# Patient Record
Sex: Female | Born: 2000 | Race: White | Hispanic: No | Marital: Single | Smoking: Never smoker
Health system: Southern US, Community
[De-identification: ages and names within clinical notes are randomized; demographics above are authoritative.]

## PROBLEM LIST (undated history)

## (undated) DIAGNOSIS — N926 Irregular menstruation, unspecified: Secondary | ICD-10-CM

## (undated) HISTORY — PX: NO PAST SURGERIES: SHX2092

## (undated) HISTORY — DX: Irregular menstruation, unspecified: N92.6

---
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
## 2020-02-19 ENCOUNTER — Ambulatory Visit (INDEPENDENT_AMBULATORY_CARE_PROVIDER_SITE_OTHER): Payer: PRIVATE HEALTH INSURANCE | Admitting: Certified Nurse Midwife

## 2020-02-19 ENCOUNTER — Other Ambulatory Visit: Payer: Self-pay

## 2020-02-19 VITALS — BP 132/77 | HR 112 | Ht 61.0 in | Wt 111.7 lb

## 2020-02-19 DIAGNOSIS — Z3042 Encounter for surveillance of injectable contraceptive: Secondary | ICD-10-CM

## 2020-02-19 MED ORDER — MEDROXYPROGESTERONE ACETATE 150 MG/ML IM SUSP
150.0000 mg | Freq: Once | INTRAMUSCULAR | Status: AC
  Administered 2020-02-19: 14:00:00 150 mg via INTRAMUSCULAR

## 2020-02-19 NOTE — Progress Notes (Signed)
Date last pap:NA Last Depo-Provera: 11/27/2019 Side Effects if any: NA Serum HCG indicated? NA Depo-Provera 150 mg IM given by: J. [facility] CMA Next appointment due 05/06/20-05/20/20  Today's Vitals   02/19/20 1400  BP: 132/77  Pulse: (!) 112  Weight: 111 lb 11.2 oz (50.7 kg)  Height: 5\' 1"  (1.549 m)   Body mass index is 21.11 kg/m.

## 2020-05-07 ENCOUNTER — Ambulatory Visit: Payer: PRIVATE HEALTH INSURANCE

## 2020-05-14 ENCOUNTER — Other Ambulatory Visit: Payer: Self-pay | Admitting: Certified Nurse Midwife

## 2020-05-14 ENCOUNTER — Ambulatory Visit: Payer: PRIVATE HEALTH INSURANCE

## 2020-05-16 ENCOUNTER — Ambulatory Visit (INDEPENDENT_AMBULATORY_CARE_PROVIDER_SITE_OTHER): Payer: PRIVATE HEALTH INSURANCE | Admitting: Surgical

## 2020-05-16 ENCOUNTER — Other Ambulatory Visit: Payer: Self-pay

## 2020-05-16 DIAGNOSIS — Z3042 Encounter for surveillance of injectable contraceptive: Secondary | ICD-10-CM

## 2020-05-16 MED ORDER — MEDROXYPROGESTERONE ACETATE 150 MG/ML IM SUSP
150.0000 mg | Freq: Once | INTRAMUSCULAR | Status: AC
  Administered 2020-05-16: 150 mg via INTRAMUSCULAR

## 2020-05-16 NOTE — Progress Notes (Signed)
Date last pap:NA Last Depo-Provera: 02/19/2020 Side Effects if any:NA Serum HCG indicated? NA Depo-Provera 150 mg IM given by: J. [facility] CMA Next appointment due 08/01/2020-08/15/2020

## 2020-06-16 ENCOUNTER — Emergency Department: Payer: BC Managed Care – PPO

## 2020-06-16 ENCOUNTER — Emergency Department
Admission: EM | Admit: 2020-06-16 | Discharge: 2020-06-16 | Disposition: A | Discharge status: Home / Self Care | Payer: BC Managed Care – PPO | Attending: Emergency Medicine | Admitting: Emergency Medicine

## 2020-06-16 ENCOUNTER — Other Ambulatory Visit: Payer: Self-pay

## 2020-06-16 ENCOUNTER — Encounter: Payer: Self-pay | Admitting: Emergency Medicine

## 2020-06-16 DIAGNOSIS — N10 Acute tubulo-interstitial nephritis: Secondary | ICD-10-CM | POA: Insufficient documentation

## 2020-06-16 DIAGNOSIS — R300 Dysuria: Secondary | ICD-10-CM | POA: Diagnosis present

## 2020-06-16 LAB — URINALYSIS, ROUTINE W REFLEX MICROSCOPIC
Bacteria, UA: NONE SEEN
Bilirubin Urine: NEGATIVE
Glucose, UA: NEGATIVE mg/dL
Ketones, ur: NEGATIVE mg/dL
Nitrite: NEGATIVE
Protein, ur: 30 mg/dL — AB
Specific Gravity, Urine: 1.016 (ref 1.005–1.030)
WBC, UA: >50 WBC/hpf — ABNORMAL HIGH (ref 0–5)
pH: 5 (ref 5.0–8.0)

## 2020-06-16 LAB — POCT PREGNANCY, URINE: Preg Test, Ur: NEGATIVE

## 2020-06-16 MED ORDER — ONDANSETRON 4 MG PO TBDP
4.0000 mg | ORAL_TABLET | Freq: Once | ORAL | Status: AC
  Administered 2020-06-16: 4 mg via ORAL
  Filled 2020-06-16: qty 1

## 2020-06-16 MED ORDER — SULFAMETHOXAZOLE-TRIMETHOPRIM 800-160 MG PO TABS
1.0000 | ORAL_TABLET | Freq: Once | ORAL | Status: AC
  Administered 2020-06-16: 1 via ORAL
  Filled 2020-06-16: qty 1

## 2020-06-16 MED ORDER — CEFTRIAXONE SODIUM 1 G IJ SOLR
1.0000 g | Freq: Once | INTRAMUSCULAR | Status: AC
Start: 2020-06-16 — End: 2020-06-16
  Administered 2020-06-16: 1 g via INTRAMUSCULAR
  Filled 2020-06-16: qty 10

## 2020-06-16 MED ORDER — PHENAZOPYRIDINE HCL 100 MG PO TABS
95.0000 mg | ORAL_TABLET | Freq: Once | ORAL | Status: AC
  Administered 2020-06-16: 100 mg via ORAL
  Filled 2020-06-16: qty 1

## 2020-06-16 MED ORDER — SULFAMETHOXAZOLE-TRIMETHOPRIM 800-160 MG PO TABS: 1.0000 | ORAL_TABLET | Freq: Two times a day (BID) | ORAL | 0 refills | Status: DC

## 2020-06-16 NOTE — ED Provider Notes (Signed)
[facility] Emergency Department Provider Note  ____________________________________________  Time seen: Approximately 10:36 AM  I have reviewed the triage vital signs and the nursing notes.   HISTORY  Chief Complaint Dysuria    HPI Cleatus Goodin is a 19 y.o. female that presents to the emergency department for evaluation of urinary urgency, frequency, right flank pain for 2 days.  Patient states that when she urinates, she urinates very little at a time and then has to go very shortly after.  Patient states that she has a history of urinary tract infections and this feels the same.  She is concerned for a kidney infection.  She is usually able to take cranberry juice, which resolves the symptoms.  She has felt nauseous.  She does not have a menstrual cycle and denies any vaginal bleeding.  No fevers, vomiting, abdominal pain.   Past Medical History:  Diagnosis Date  . Irregular periods/menstrual cycles     Patient Active Problem List   Diagnosis Date Noted  . Breakthrough bleeding on Depo-Provera 06/22/2019    Past Surgical History:  Procedure Laterality Date  . NO PAST SURGERIES      Prior to Admission medications   Medication Sig Start Date End Date Taking? Authorizing Provider  medroxyPROGESTERone (DEPO-PROVERA) 150 MG/ML injection ADMINISTER 1 ML(150 MG) IN THE MUSCLE EVERY 3 MONTHS 05/14/20   Lawhorn, Vanessa Maryhill, CNM  sulfamethoxazole-trimethoprim (BACTRIM DS) 800-160 MG tablet Take 1 tablet by mouth 2 (two) times daily. 06/16/20   Enid Derry, PA-C    Allergies Patient has no known allergies.  Family History  Problem Relation Age of Onset  . Cancer Father        skin   . Breast cancer Neg Hx   . Ovarian cancer Neg Hx   . Colon cancer Neg Hx   . Diabetes Neg Hx     Social History Social History   Tobacco Use  . Smoking status: Never Smoker  . Smokeless tobacco: Never Used  Vaping Use  . Vaping Use: Some days  Substance  Use Topics  . Alcohol use: No  . Drug use: No     Review of Systems  Constitutional: No fever/chills Cardiovascular: No chest pain. Respiratory: No cough. No SOB. Gastrointestinal: Positive for flank pain..  No nausea, no vomiting.  Genitourinary: Negative for dysuria.  Positive for urgency and frequency. Musculoskeletal: Negative for musculoskeletal pain. Skin: Negative for rash, abrasions, lacerations, ecchymosis. Neurological: Negative for headaches   ____________________________________________   PHYSICAL EXAM:  VITAL SIGNS: ED Triage Vitals  Enc Vitals Group     BP 06/16/20 1000 112/75     Pulse Rate 06/16/20 1000 88     Resp 06/16/20 1000 18     Temp 06/16/20 1000 98.2 F (36.8 C)     Temp Source 06/16/20 1000 Oral     SpO2 06/16/20 1000 99 %     Weight 06/16/20 0959 109 lb (49.4 kg)     Height 06/16/20 0959 5\' 1"  (1.549 m)     Head Circumference --      Peak Flow --      Pain Score 06/16/20 1004 4     Pain Loc --      Pain Edu? --      Excl. in GC? --      Constitutional: Alert and oriented. Well appearing and in no acute distress. Eyes: Conjunctivae are normal. PERRL. EOMI. Head: Atraumatic. ENT:      Ears:  Nose: No congestion/rhinnorhea.      Mouth/Throat: Mucous membranes are moist.  Neck: No stridor.  Cardiovascular: Normal rate, regular rhythm.  Good peripheral circulation. Respiratory: Normal respiratory effort without tachypnea or retractions. Lungs CTAB. Good air entry to the bases with no decreased or absent breath sounds. Gastrointestinal: Bowel sounds 4 quadrants. Soft and nontender to palpation. No guarding or rigidity. No palpable masses. No distention.  Right CVA tenderness. Musculoskeletal: Full range of motion to all extremities. No gross deformities appreciated. Neurologic:  Normal speech and language. No gross focal neurologic deficits are appreciated.  Skin:  Skin is warm, dry and intact. No rash noted. Psychiatric: Mood and  affect are normal. Speech and behavior are normal. Patient exhibits appropriate insight and judgement.   ____________________________________________   LABS (all labs ordered are listed, but only abnormal results are displayed)  Labs Reviewed  URINALYSIS, ROUTINE W REFLEX MICROSCOPIC - Abnormal; Notable for the following components:      Result Value   Color, Urine YELLOW (*)    APPearance CLOUDY (*)    Hgb urine dipstick SMALL (*)    Protein, ur 30 (*)    Leukocytes,Ua LARGE (*)    WBC, UA >50 (*)    All other components within normal limits  URINE CULTURE  POC URINE PREG, ED  POCT PREGNANCY, URINE   ____________________________________________  EKG   ____________________________________________  RADIOLOGY  US Renal  Result Date: 06/16/2020 CLINICAL DATA:  UTI, flank pain EXAM: RENAL / URINARY TRACT ULTRASOUND COMPLETE COMPARISON:  None. FINDINGS: Right Kidney: Renal measurements: 10.6 x 4.4 x 5.2 cm = volume: 126 mL . Echogenicity within normal limits. No mass or hydronephrosis visualized. Left Kidney: Renal measurements: 10.6 x 5.7 x 4.3 cm = volume: 137 mL. Echogenicity within normal limits. No mass or hydronephrosis visualized. Bladder: Appears normal for degree of bladder distention. Other: None. IMPRESSION: Normal ultrasound examination of the bilateral kidneys and urinary bladder. Electronically Signed   By: Lauralyn Primes M.D.   On: 06/16/2020 11:38    ____________________________________________    PROCEDURES  Procedure(s) performed:    Procedures    Medications  cefTRIAXone (ROCEPHIN) injection 1 g (1 g Intramuscular Given 06/16/20 1114)  ondansetron (ZOFRAN-ODT) disintegrating tablet 4 mg (4 mg Oral Given 06/16/20 1114)  phenazopyridine (PYRIDIUM) tablet 100 mg (100 mg Oral Given 06/16/20 1121)  sulfamethoxazole-trimethoprim (BACTRIM DS) 800-160 MG per tablet 1 tablet (1 tablet Oral Given 06/16/20 1302)      ____________________________________________   INITIAL IMPRESSION / ASSESSMENT AND PLAN / ED COURSE  Pertinent labs & imaging results that were available during my care of the patient were reviewed by me and considered in my medical decision making (see chart for details).  Review of the [place] CSRS was performed in accordance of the [facility] prior to dispensing any controlled drugs.  Differential diagnosis includes, but is not limited to, biliary disease (biliary colic, acute cholecystitis, cholangitis, choledocholithiasis, etc), renal stone, pyelonephritis, intrathoracic causes for epigastric abdominal pain including ACS, gastritis, pancreatitis, small bowel or large bowel obstruction, abdominal aortic aneurysm, hernia, and ulcer(s).   Patient's diagnosis is consistent with urinary tract infection.  Vital signs and exam are reassuring.  Symptoms are consistent with a urinary tract infection.  Patient is nontoxic.  Renal ultrasound negative for acute abnormalities.  Urinalysis shows hemoglobin, red blood cells, white blood cells, leukocytes.  Patient does not have a menstrual cycle and denies any vaginal bleeding.  Urine will be sent for culture.  Patient was given IM ceftriaxone  for infection.  Patient will be discharged home with prescriptions for Bactrim. Patient is to follow up with PCP as directed. Patient is given ED precautions to return to the ED for any worsening or new symptoms.  Apollonia Amini was evaluated in Emergency Department on 06/16/2020 for the symptoms described in the history of present illness. She was evaluated in the context of the global COVID-19 pandemic, which necessitated consideration that the patient might be at risk for infection with the SARS-CoV-2 virus that causes COVID-19. Institutional protocols and algorithms that pertain to the evaluation of patients at risk for COVID-19 are in a state of rapid change based on information released by regulatory bodies including the  CDC and federal and state organizations. These policies and algorithms were followed during the patient's care in the ED.   ____________________________________________  FINAL CLINICAL IMPRESSION(S) / ED DIAGNOSES  Final diagnoses:  Acute pyelonephritis      NEW MEDICATIONS STARTED DURING THIS VISIT:  ED Discharge Orders         Ordered    sulfamethoxazole-trimethoprim (BACTRIM DS) 800-160 MG tablet  2 times daily     Discontinue  Reprint     06/16/20 1238              This chart was dictated using voice recognition software/Dragon. Despite best efforts to proofread, errors can occur which can change the meaning. Any change was purely unintentional.    Enid Derry, PA-C 06/16/20 1509    Sharyn Creamer, MD 06/16/20 [phone]

## 2020-06-16 NOTE — ED Triage Notes (Signed)
First RN Note: pt presents to ED via POV, ambulatory to the desk without any difficulty at this time. Pt states "I think I have a kidney infection".

## 2020-06-16 NOTE — ED Triage Notes (Signed)
Pt to ER reports frequent urination, left flank pain and nausea.  Pt reports symptoms for last 4 days.  PT states hx of kidney infections.

## 2020-06-19 LAB — URINE CULTURE: Culture: >=100000 — AB

## 2020-06-24 ENCOUNTER — Other Ambulatory Visit: Payer: Self-pay

## 2020-06-24 ENCOUNTER — Ambulatory Visit (INDEPENDENT_AMBULATORY_CARE_PROVIDER_SITE_OTHER): Payer: PRIVATE HEALTH INSURANCE | Admitting: Certified Nurse Midwife

## 2020-06-24 ENCOUNTER — Encounter: Payer: Self-pay | Admitting: Certified Nurse Midwife

## 2020-06-24 VITALS — BP 116/75 | HR 102 | Ht 61.0 in | Wt 106.7 lb

## 2020-06-24 DIAGNOSIS — R110 Nausea: Secondary | ICD-10-CM | POA: Diagnosis not present

## 2020-06-24 DIAGNOSIS — Z3042 Encounter for surveillance of injectable contraceptive: Secondary | ICD-10-CM | POA: Diagnosis not present

## 2020-06-24 DIAGNOSIS — Z01419 Encounter for gynecological examination (general) (routine) without abnormal findings: Secondary | ICD-10-CM | POA: Diagnosis not present

## 2020-06-24 DIAGNOSIS — Z87440 Personal history of urinary (tract) infections: Secondary | ICD-10-CM | POA: Diagnosis not present

## 2020-06-24 DIAGNOSIS — N6315 Unspecified lump in the right breast, overlapping quadrants: Secondary | ICD-10-CM | POA: Insufficient documentation

## 2020-06-24 MED ORDER — MEDROXYPROGESTERONE ACETATE 150 MG/ML IM SUSP: INTRAMUSCULAR | 3 refills | Status: DC

## 2020-06-24 MED ORDER — ONDANSETRON 4 MG PO TBDP: 4.0000 mg | ORAL_TABLET | Freq: Four times a day (QID) | ORAL | 0 refills | Status: DC | PRN

## 2020-06-24 NOTE — Progress Notes (Addendum)
ANNUAL PREVENTATIVE CARE GYN  ENCOUNTER NOTE  Subjective:       Colleen Castaneda is a 19 y.o. G0P0000 female here for a routine annual gynecologic exam.  Current complaints: 1. Needs Depo refill 2. Nausea since kidney infection, requests medications to help with symptoms 3. Right breast lump  Denies difficulty breathing or respiratory distress, chest pain, abdominal pain, excessive vaginal bleeding, dysuria, and leg pain or swelling.    Gynecologic History  Patient's last menstrual period was 06/21/2020.  Contraception: Depo-Provera injections; last dose: 05/16/2020.  Last Pap: N/A  Obstetric History  OB History  Gravida Para Term Preterm AB Living  0 0 0 0 0 0  SAB TAB Ectopic Multiple Live Births  0 0 0 0 0    Past Medical History:  Diagnosis Date  . Irregular periods/menstrual cycles     Past Surgical History:  Procedure Laterality Date  . NO PAST SURGERIES     No Known Allergies  Social History   Socioeconomic History  . Marital status: Single    Spouse name: Not on file  . Number of children: Not on file  . Years of education: Not on file  . Highest education level: Not on file  Occupational History  . Not on file  Tobacco Use  . Smoking status: Never Smoker  . Smokeless tobacco: Never Used  Vaping Use  . Vaping Use: Some days  Substance and Sexual Activity  . Alcohol use: No  . Drug use: No  . Sexual activity: Yes    Birth control/protection: Injection  Other Topics Concern  . Not on file  Social History Narrative  . Not on file   Social Determinants of Health   Financial Resource Strain:   . Difficulty of Paying Living Expenses:   Food Insecurity:   . Worried About Programme researcher, broadcasting/film/video in the Last Year:   . Barista in the Last Year:   Transportation Needs:   . Freight forwarder (Medical):   Marland Kitchen Lack of Transportation (Non-Medical):   Physical Activity:   . Days of Exercise per Week:   . Minutes of Exercise per Session:    Stress:   . Feeling of Stress :   Social Connections:   . Frequency of Communication with Friends and Family:   . Frequency of Social Gatherings with Friends and Family:   . Attends Religious Services:   . Active Member of Clubs or Organizations:   . Attends Banker Meetings:   Marland Kitchen Marital Status:   Intimate Partner Violence:   . Fear of Current or Ex-Partner:   . Emotionally Abused:   Marland Kitchen Physically Abused:   . Sexually Abused:     Family History  Problem Relation Age of Onset  . Cancer Father        skin   . Breast cancer Neg Hx   . Ovarian cancer Neg Hx   . Colon cancer Neg Hx   . Diabetes Neg Hx     The following portions of the patient's history were reviewed and updated as appropriate: allergies, current medications, past family history, past medical history, past social history, past surgical history and problem list.  Review of Systems  ROS negative except as noted above. Information obtained from patient.    Objective:   BP 116/75   Pulse (!) 102   Ht 5\' 1"  (1.549 m)   Wt 106 lb 11.2 oz (48.4 kg)   LMP 06/21/2020   BMI 20.16  kg/m    CONSTITUTIONAL: Well-developed, well-nourished female in no acute distress.   PSYCHIATRIC: Normal mood and affect. Normal behavior. Normal judgment and thought content.  NEUROLGIC: Alert and oriented to person, place, and time. Normal muscle tone coordination. No cranial nerve deficit noted.  HENT:  Normocephalic, atraumatic, External right and left ear normal.   EYES: Conjunctivae and EOM are normal. Pupils are equal and round.   NECK: Normal range of motion, supple, no masses.  Normal thyroid.   SKIN: Skin is warm and dry. No rash noted. Not diaphoretic. No erythema. No pallor.  CARDIOVASCULAR: Normal heart rate noted, regular rhythm, no murmur.  RESPIRATORY: Clear to auscultation bilaterally. Effort and breath sounds normal, no problems with respiration noted.  BREASTS: Symmetric in size.   Left-No  masses, skin changes, nipple drainage, or lymphadenopathy.  Right-Round, soft, mobile grape sized lump located at noon  ABDOMEN: Soft, normal bowel sounds, no distention noted.  No tenderness, rebound or guarding.   PELVIC:  External Genitalia: Normal   MUSCULOSKELETAL: Normal range of motion. No tenderness.  No cyanosis, clubbing, or edema.  2+ distal pulses.  LYMPHATIC: No Axillary, Supraclavicular, or Inguinal Adenopathy.  Assessment:   Annual gynecologic examination 19 y.o.   Contraception: Depo-Provera injections   Normal BMI   Problem List Items Addressed This Visit    None    Visit Diagnoses    Encounter for well woman exam with routine gynecological exam    -  Primary   Surveillance for Depo-Provera contraception       Nausea       History of kidney infection       Breast lump on right side at 12 o'clock position          Plan:   Pap: Not needed, STI testing declined  Labs: Declined  Routine preventative health maintenance measures emphasized: Exercise/Diet/Weight control, Tobacco Warnings, Alcohol/Substance use risks, Stress Management, Peer Pressure Issues and Safe Sex; see AVS  Rx Depo provera and Zofran, see orders  Reviewed red flag symptoms and when to call  Return to Clinic - 1 Year for [facility] or sooner if needed   Serafina Royals, CNM  [facility], [facility] 06/24/20 3:44 PM

## 2020-06-24 NOTE — Patient Instructions (Addendum)
Medroxyprogesterone injection [Contraceptive] What is this medicine? MEDROXYPROGESTERONE (me DROX ee proe JES te rone) contraceptive injections prevent pregnancy. They provide effective birth control for 3 months. Depo-subQ Provera 104 is also used for treating pain related to endometriosis. This medicine may be used for other purposes; ask your health care provider or pharmacist if you have questions. COMMON BRAND NAME(S): Depo-Provera, Depo-subQ Provera 104 What should I tell my health care provider before I take this medicine? They need to know if you have any of these conditions:  frequently drink alcohol  asthma  blood vessel disease or a history of a blood clot in the lungs or legs  bone disease such as osteoporosis  breast cancer  diabetes  eating disorder (anorexia nervosa or bulimia)  high blood pressure  HIV infection or AIDS  kidney disease  liver disease  mental depression  migraine  seizures (convulsions)  stroke  tobacco smoker  vaginal bleeding  an unusual or allergic reaction to medroxyprogesterone, other hormones, medicines, foods, dyes, or preservatives  pregnant or trying to get pregnant  breast-feeding How should I use this medicine? Depo-Provera Contraceptive injection is given into a muscle. Depo-subQ Provera 104 injection is given under the skin. These injections are given by a health care professional. You must not be pregnant before getting an injection. The injection is usually given during the first 5 days after the start of a menstrual period or 6 weeks after delivery of a baby. Talk to your pediatrician regarding the use of this medicine in children. Special care may be needed. These injections have been used in female children who have started having menstrual periods. Overdosage: If you think you have taken too much of this medicine contact a poison control center or emergency room at once. NOTE: This medicine is only for you. Do not  share this medicine with others. What if I miss a dose? Try not to miss a dose. You must get an injection once every 3 months to maintain birth control. If you cannot keep an appointment, call and reschedule it. If you wait longer than 13 weeks between Depo-Provera contraceptive injections or longer than 14 weeks between Depo-subQ Provera 104 injections, you could get pregnant. Use another method for birth control if you miss your appointment. You may also need a pregnancy test before receiving another injection. What may interact with this medicine? Do not take this medicine with any of the following medications:  bosentan This medicine may also interact with the following medications:  aminoglutethimide  antibiotics or medicines for infections, especially rifampin, rifabutin, rifapentine, and griseofulvin  aprepitant  barbiturate medicines such as phenobarbital or primidone  bexarotene  carbamazepine  medicines for seizures like ethotoin, felbamate, oxcarbazepine, phenytoin, topiramate  modafinil  St. John's wort This list may not describe all possible interactions. Give your health care provider a list of all the medicines, herbs, non-prescription drugs, or dietary supplements you use. Also tell them if you smoke, drink alcohol, or use illegal drugs. Some items may interact with your medicine. What should I watch for while using this medicine? This drug does not protect you against HIV infection (AIDS) or other sexually transmitted diseases. Use of this product may cause you to lose calcium from your bones. Loss of calcium may cause weak bones (osteoporosis). Only use this product for more than 2 years if other forms of birth control are not right for you. The longer you use this product for birth control the more likely you will be at risk  for weak bones. Ask your health care professional how you can keep strong bones. You may have a change in bleeding pattern or irregular periods.  Many females stop having periods while taking this drug. If you have received your injections on time, your chance of being pregnant is very low. If you think you may be pregnant, see your health care professional as soon as possible. Tell your health care professional if you want to get pregnant within the next year. The effect of this medicine may last a long time after you get your last injection. What side effects may I notice from receiving this medicine? Side effects that you should report to your doctor or health care professional as soon as possible:  allergic reactions like skin rash, itching or hives, swelling of the face, lips, or tongue  breast tenderness or discharge  breathing problems  changes in vision  depression  feeling faint or lightheaded, falls  fever  pain in the abdomen, chest, groin, or leg  problems with balance, talking, walking  unusually weak or tired  yellowing of the eyes or skin Side effects that usually do not require medical attention (report to your doctor or health care professional if they continue or are bothersome):  acne  fluid retention and swelling  headache  irregular periods, spotting, or absent periods  temporary pain, itching, or skin reaction at site where injected  weight gain This list may not describe all possible side effects. Call your doctor for medical advice about side effects. You may report side effects to FDA at 1-800-FDA-1088. Where should I keep my medicine? This does not apply. The injection will be given to you by a health care professional. NOTE: This sheet is a summary. It may not cover all possible information. If you have questions about this medicine, talk to your doctor, pharmacist, or health care provider.  2020 Elsevier/Gold Standard (2008-12-21 18:37:56)   Preventive Care 37-26 Years Old, Female Preventive care refers to lifestyle choices and visits with your health care provider that can promote  health and wellness. At this stage in your life, you may start seeing a primary care physician instead of a pediatrician. Your health care is now your responsibility. Preventive care for young adults includes:  A yearly physical exam. This is also called an annual wellness visit.  Regular dental and eye exams.  Immunizations.  Screening for certain conditions.  Healthy lifestyle choices, such as diet and exercise. What can I expect for my preventive care visit? Physical exam Your health care provider may check:  Height and weight. These may be used to calculate body mass index (BMI), which is a measurement that tells if you are at a healthy weight.  Heart rate and blood pressure.  Body temperature. Counseling Your health care provider may ask you questions about:  Past medical problems and family medical history.  Alcohol, tobacco, and drug use.  Home and relationship well-being.  Access to firearms.  Emotional well-being.  Diet, exercise, and sleep habits.  Sexual activity and sexual health.  Method of birth control.  Menstrual cycle.  Pregnancy history. What immunizations do I need?  Influenza (flu) vaccine  This is recommended every year. Tetanus, diphtheria, and pertussis (Tdap) vaccine  You may need a Td booster every 10 years. Varicella (chickenpox) vaccine  You may need this vaccine if you have not already been vaccinated. Human papillomavirus (HPV) vaccine  If recommended by your health care provider, you may need three doses over 6 months.  Measles, mumps, and rubella (MMR) vaccine  You may need at least one dose of MMR. You may also need a second dose. Meningococcal conjugate (MenACWY) vaccine  One dose is recommended if you are 17-20 years old and a Market researcher living in a residence hall, or if you have one of several medical conditions. You may also need additional booster doses. Pneumococcal conjugate (PCV13) vaccine  You may  need this if you have certain conditions and were not previously vaccinated. Pneumococcal polysaccharide (PPSV23) vaccine  You may need one or two doses if you smoke cigarettes or if you have certain conditions. Hepatitis A vaccine  You may need this if you have certain conditions or if you travel or work in places where you may be exposed to hepatitis A. Hepatitis B vaccine  You may need this if you have certain conditions or if you travel or work in places where you may be exposed to hepatitis B. Haemophilus influenzae type b (Hib) vaccine  You may need this if you have certain risk factors. You may receive vaccines as individual doses or as more than one vaccine together in one shot (combination vaccines). Talk with your health care provider about the risks and benefits of combination vaccines. What tests do I need? Blood tests  Lipid and cholesterol levels. These may be checked every 5 years starting at age 68.  Hepatitis C test.  Hepatitis B test. Screening  Pelvic exam and Pap test. This may be done every 3 years starting at age 58.  Sexually transmitted disease (STD) testing, if you are at risk.  BRCA-related cancer screening. This may be done if you have a family history of breast, ovarian, tubal, or peritoneal cancers. Other tests  Tuberculosis skin test.  Vision and hearing tests.  Skin exam.  Breast exam. Follow these instructions at home: Eating and drinking   Eat a diet that includes fresh fruits and vegetables, whole grains, lean protein, and low-fat dairy products.  Drink enough fluid to keep your urine pale yellow.  Do not drink alcohol if: ? Your health care provider tells you not to drink. ? You are pregnant, may be pregnant, or are planning to become pregnant. ? You are under the legal drinking age. In the [place] the legal drinking age is 90.  If you drink alcohol: ? Limit how much you have to 0-1 drink a day. ? Be aware of how much alcohol is in  your drink. In the [place] one drink equals one 12 oz bottle of beer (355 mL), one 5 oz glass of wine (148 mL), or one 1 oz glass of hard liquor (44 mL). Lifestyle  Take daily care of your teeth and gums.  Stay active. Exercise at least 30 minutes 5 or more days of the week.  Do not use any products that contain nicotine or tobacco, such as cigarettes, e-cigarettes, and chewing tobacco. If you need help quitting, ask your health care provider.  Do not use drugs.  If you are sexually active, practice safe sex. Use a condom or other form of birth control (contraception) in order to prevent pregnancy and STIs (sexually transmitted infections). If you plan to become pregnant, see your health care provider for a pre-conception visit.  Find healthy ways to cope with stress, such as: ? Meditation, yoga, or listening to music. ? Journaling. ? Talking to a trusted person. ? Spending time with friends and family. Safety  Always wear your seat belt while driving or riding  in a vehicle.  Do not drive if you have been drinking alcohol. Do not ride with someone who has been drinking.  Do not drive when you are tired or distracted. Do not text while driving.  Wear a helmet and other protective equipment during sports activities.  If you have firearms in your house, make sure you follow all gun safety procedures.  Seek help if you have been bullied, physically abused, or sexually abused.  Use the Internet responsibly to avoid dangers such as online bullying and online sex predators. What's next?  Go to your health care provider once a year for a well check visit.  Ask your health care provider how often you should have your eyes and teeth checked.  Stay up to date on all vaccines. This information is not intended to replace advice given to you by your health care provider. Make sure you discuss any questions you have with your health care provider. Document Revised: 11/24/2018 Document  Reviewed: 11/24/2018 Elsevier Patient Education  2020 [employer] Breast self-awareness is knowing how your breasts look and feel. Doing breast self-awareness is important. It allows you to catch a breast problem early while it is still small and can be treated. All women should do breast self-awareness, including women who have had breast implants. Tell your doctor if you notice a change in your breasts. What you need:  A mirror.  A well-lit room. How to do a breast self-exam A breast self-exam is one way to learn what is normal for your breasts and to check for changes. To do a breast self-exam: Look for changes  1. Take off all the clothes above your waist. 2. Stand in front of a mirror in a room with good lighting. 3. Put your hands on your hips. 4. Push your hands down. 5. Look at your breasts and nipples in the mirror to see if one breast or nipple looks different from the other. Check to see if: ? The shape of one breast is different. ? The size of one breast is different. ? There are wrinkles, dips, and bumps in one breast and not the other. 6. Look at each breast for changes in the skin, such as: ? Redness. ? Scaly areas. 7. Look for changes in your nipples, such as: ? Liquid around the nipples. ? Bleeding. ? Dimpling. ? Redness. ? A change in where the nipples are. Feel for changes  1. Lie on your back on the floor. 2. Feel each breast. To do this, follow these steps: ? Pick a breast to feel. ? Put the arm closest to that breast above your head. ? Use your other arm to feel the nipple area of your breast. Feel the area with the pads of your three middle fingers by making small circles with your fingers. For the first circle, press lightly. For the second circle, press harder. For the third circle, press even harder. ? Keep making circles with your fingers at the different pressures as you move down your breast. Stop when you feel your  ribs. ? Move your fingers a little toward the center of your body. ? Start making circles with your fingers again, this time going up until you reach your collarbone. ? Keep making up-and-down circles until you reach your armpit. Remember to keep using the three pressures. ? Feel the other breast in the same way. 3. Sit or stand in the tub or shower. 4. With soapy water on your  skin, feel each breast the same way you did in step 2 when you were lying on the floor. Write down what you find Writing down what you find can help you remember what to tell your doctor. Write down:  What is normal for each breast.  Any changes you find in each breast, including: ? The kind of changes you find. ? Whether you have pain. ? Size and location of any lumps.  When you last had your menstrual period. General tips  Check your breasts every month.  If you are breastfeeding, the best time to check your breasts is after you feed your baby or after you use a breast pump.  If you get menstrual periods, the best time to check your breasts is 5-7 days after your menstrual period is over.  With time, you will become comfortable with the self-exam, and you will begin to know if there are changes in your breasts. Contact a doctor if you:  See a change in the shape or size of your breasts or nipples.  See a change in the skin of your breast or nipples, such as red or scaly skin.  Have fluid coming from your nipples that is not normal.  Find a lump or thick area that was not there before.  Have pain in your breasts.  Have any concerns about your breast health. Summary  Breast self-awareness includes looking for changes in your breasts, as well as feeling for changes within your breasts.  Breast self-awareness should be done in front of a mirror in a well-lit room.  You should check your breasts every month. If you get menstrual periods, the best time to check your breasts is 5-7 days after your  menstrual period is over.  Let your doctor know of any changes you see in your breasts, including changes in size, changes on the skin, pain or tenderness, or fluid from your nipples that is not normal. This information is not intended to replace advice given to you by your health care provider. Make sure you discuss any questions you have with your health care provider. Document Revised: 07/19/2018 Document Reviewed: 07/19/2018 Elsevier Patient Education  2020 [employer].  Fibrocystic Breast Changes  Fibrocystic breast changes are changes that can make your breasts swollen or painful. These changes happen when tiny sacs of fluid (cysts) form in the breast. This is a common condition. It does not mean that you have cancer. It usually happens because of hormone changes during a monthly period. Follow these instructions at home:  Check your breasts after every monthly period. If you do not have monthly periods, check your breasts on the first day of every month. Check for: ? Soreness. ? New swelling or puffiness. ? A change in breast size. ? A change in a lump that was already there.  Take over-the-counter and prescription medicines only as told by your doctor.  Wear a support or sports bra that fits well. Wear this support especially when you are exercising.  Avoid or have less caffeine, fat, and sugar in what you eat and drink as told by your doctor. Contact a doctor if:  You have fluid coming from your nipple, especially if the fluid has blood in it.  You have new lumps or bumps in your breast.  Your breast gets puffy, red, and painful.  You have changes in how your breast looks.  Your nipple looks flat or it sinks into your breast. Get help right away if:  Your  breast turns red, and the redness is spreading. Summary  Fibrocystic breast changes are changes that can make your breasts swollen or painful.  This condition can happen when you have hormone changes during your  monthly period.  With this condition, it is important to check your breasts after every monthly period. If you do not have monthly periods, check your breasts on the first day of every month. This information is not intended to replace advice given to you by your health care provider. Make sure you discuss any questions you have with your health care provider. Document Revised: 03/23/2019 Document Reviewed: 08/13/2016 Elsevier Patient Education  2020 [employer].

## 2020-06-24 NOTE — Progress Notes (Signed)
Pt present for annual check and medication refill of birth control.  Pt stated that she was doing well no problems.

## 2020-07-19 ENCOUNTER — Emergency Department
Admission: EM | Admit: 2020-07-19 | Discharge: 2020-07-19 | Discharge status: Absent without leave | Payer: Commercial Indemnity

## 2020-08-07 ENCOUNTER — Ambulatory Visit: Payer: PRIVATE HEALTH INSURANCE

## 2020-08-08 ENCOUNTER — Other Ambulatory Visit: Payer: Self-pay

## 2020-08-08 ENCOUNTER — Ambulatory Visit (INDEPENDENT_AMBULATORY_CARE_PROVIDER_SITE_OTHER): Payer: PRIVATE HEALTH INSURANCE | Admitting: Certified Nurse Midwife

## 2020-08-08 DIAGNOSIS — Z3042 Encounter for surveillance of injectable contraceptive: Secondary | ICD-10-CM | POA: Diagnosis not present

## 2020-08-08 MED ORDER — MEDROXYPROGESTERONE ACETATE 150 MG/ML IM SUSP
150.0000 mg | Freq: Once | INTRAMUSCULAR | Status: AC
  Administered 2020-08-08: 150 mg via INTRAMUSCULAR

## 2020-08-08 NOTE — Progress Notes (Signed)
Date last pap: underage Last Depo-Provera: 05/16/2020 Side Effects if any: none Serum HCG indicated? N/A Depo-Provera 150 mg IM given by: Jari Pigg, LPN Next appointment due: November 11-25, 2021.

## 2020-08-08 NOTE — Progress Notes (Signed)
I have reviewed the record and concur with patient management and plan of care.    Serafina Royals, CNM [facility], [facility] 08/08/20 3:22 PM

## 2020-08-22 ENCOUNTER — Encounter: Payer: Self-pay | Admitting: Certified Nurse Midwife

## 2020-08-22 ENCOUNTER — Ambulatory Visit (INDEPENDENT_AMBULATORY_CARE_PROVIDER_SITE_OTHER): Payer: PRIVATE HEALTH INSURANCE | Admitting: Certified Nurse Midwife

## 2020-08-22 ENCOUNTER — Other Ambulatory Visit: Payer: Self-pay

## 2020-08-22 VITALS — BP 120/78 | HR 78 | Ht 61.0 in | Wt 110.3 lb

## 2020-08-22 DIAGNOSIS — N6315 Unspecified lump in the right breast, overlapping quadrants: Secondary | ICD-10-CM

## 2020-08-22 NOTE — Patient Instructions (Signed)
Breast Cyst  A breast cyst is a sac in the breast that is filled with fluid. They are usually noncancerous (benign) and are common among women. Breast cysts are most often in the upper, outer portion of the breast. One or more cysts may develop. They form when fluid builds up inside the breast glands. There are several types of breast cysts. Some are too small to feel, but these can be seen with imaging tests such as an X-ray of the breast (mammogram) or ultrasound. Breast cysts do not increase your risk of breast cancer. They usually disappear after you no longer have a menstrual cycle (after menopause), unless you take artificial hormones (are on hormone therapy). What are the causes? This condition may be caused by:  Blockage of tubes (ducts) in the breast glands, which leads to fluid buildup. Duct blockage may result from: ? Fibrocystic breast changes. This is a common, benign condition that occurs when women go through hormonal changes during the menstrual cycle. This is a common cause of multiple breast cysts. ? Overgrowth of breast tissue or breast glands. ? Scar tissue in the breast from previous surgery.  Changes in certain female hormones (estrogen and progesterone). The exact cause of this condition is not known. What increases the risk? You may be more likely to develop breast cysts if you have not gone through menopause. What are the signs or symptoms? Symptoms of this condition include:  Feeling one or more smooth, round, soft lumps (like grapes) in the breast that are easily movable. The lump or lumps may get bigger and more painful before your menstrual period and get smaller after your menstrual period.  Breast discomfort or pain. How is this diagnosed? This condition may be diagnosed based on:  A physical exam. A cyst can be felt by your health care provider during this exam.  Imaging tests, such as mammogram or ultrasound. Fluid may be removed from the cyst with a  needle (fine-needle aspiration) and tested to make sure the cyst is not cancerous. How is this treated? Treatment may not be needed for this condition. Your health care provider may monitor the cyst to see if it goes away on its own. If the cyst is uncomfortable or gets bigger, or if you do not like how the cyst makes your breast look, you may need treatment. Treatment may include:  Hormone therapy.  Fine-needle aspiration to drain fluid from the cyst. There is a chance of the cyst coming back (recurring) after aspiration.  Surgery to remove the cyst. Follow these instructions at home: Self-exams   Do a breast self-exam every month, or as often as directed. A breast self-exam involves: ? Comparing your breasts in the mirror. ? Looking for visible changes in your skin or nipples. ? Feeling for lumps or changes.  Having many breast cysts may make it harder to feel for new lumps. Understand how your breasts normally look and feel, and write down any changes in your breasts. Tell your health care provider about any changes. Eating and drinking  Follow instructions from your health care provider about eating and drinking restrictions.  Drink enough fluid to keep your urine pale yellow.  Avoid caffeine.  Cut down on salt (sodium) in what you eat and drink, especially before your menstrual period. Too much sodium can cause fluid buildup, breast swelling, and discomfort. General instructions  See your health care provider regularly. ? Get a yearly physical exam. ? If you are 20-40 years old, get a   clinical breast exam every 1-3 years. After the age of 40 years, get this exam every year. ? Get mammograms as often as directed.  Take over-the-counter and prescription medicines only as told by your health care provider.  Wear a supportive bra, especially when exercising.  Keep all follow-up visits as told by your health care provider. This is important. Contact a health care provider  if:  You feel, or think you feel, a lump in your breast.  You notice that both breasts look or feel different than usual.  Your breast is still causing pain after your menstrual period is over.  You find new lumps or bumps that were not there before.  You feel lumps in your armpit. Get help right away if:  You have severe pain, tenderness, redness, or warmth in your breast.  You have fluid or blood leaking from your nipple.  Your breast lump becomes hard and painful.  You notice dimpling or wrinkling of the breast or nipple. Summary  A breast cyst is a sac in the breast that is filled with fluid.  Treatment may not be needed for this condition.  If the cyst is uncomfortable or gets bigger, or if you do not like how the cyst makes your breast look, you may need treatment. This information is not intended to replace advice given to you by your health care provider. Make sure you discuss any questions you have with your health care provider. Document Revised: 04/18/2019 Document Reviewed: 04/18/2019 Elsevier Patient Education  2020 [employer]  

## 2020-08-22 NOTE — Progress Notes (Signed)
GYN ENCOUNTER NOTE  Subjective:       Colleen Castaneda is a 19 y.o. G0P0000 female is here for follow up regarding right breast lump first felt during annual exam; for further details, see previous note. No relief with home treatment measures.   Denies difficulty breathing or respiratory distress, chest pain, abdominal pain, excessive vaginal bleeding, dysuria, and leg pain or swelling.     Gynecologic History  No LMP recorded. Patient has had an injection.  Contraception: Depo-Provera injections, last dose given: 08/08/2020.  Last Pap: N/A  Obstetric History  OB History  Gravida Para Term Preterm AB Living  0 0 0 0 0 0  SAB TAB Ectopic Multiple Live Births  0 0 0 0 0    Past Medical History:  Diagnosis Date  . Irregular periods/menstrual cycles     Past Surgical History:  Procedure Laterality Date  . NO PAST SURGERIES      Current Outpatient Medications on File Prior to Visit  Medication Sig Dispense Refill  . medroxyPROGESTERone (DEPO-PROVERA) 150 MG/ML injection ADMINISTER 1 ML(150 MG) IN THE MUSCLE EVERY 3 MONTHS 1 mL 3   No current facility-administered medications on file prior to visit.    No Known Allergies  Social History   Socioeconomic History  . Marital status: Single    Spouse name: Not on file  . Number of children: Not on file  . Years of education: Not on file  . Highest education level: Not on file  Occupational History  . Not on file  Tobacco Use  . Smoking status: Never Smoker  . Smokeless tobacco: Never Used  Vaping Use  . Vaping Use: Some days  . Substances: Nicotine, Flavoring  Substance and Sexual Activity  . Alcohol use: No  . Drug use: No  . Sexual activity: Yes    Birth control/protection: Injection  Other Topics Concern  . Not on file  Social History Narrative  . Not on file   Social Determinants of Health   Financial Resource Strain:   . Difficulty of Paying Living Expenses: Not on file  Food Insecurity:   . Worried  About Programme researcher, broadcasting/film/video in the Last Year: Not on file  . Ran Out of Food in the Last Year: Not on file  Transportation Needs:   . Lack of Transportation (Medical): Not on file  . Lack of Transportation (Non-Medical): Not on file  Physical Activity:   . Days of Exercise per Week: Not on file  . Minutes of Exercise per Session: Not on file  Stress:   . Feeling of Stress : Not on file  Social Connections:   . Frequency of Communication with Friends and Family: Not on file  . Frequency of Social Gatherings with Friends and Family: Not on file  . Attends Religious Services: Not on file  . Active Member of Clubs or Organizations: Not on file  . Attends Banker Meetings: Not on file  . Marital Status: Not on file  Intimate Partner Violence:   . Fear of Current or Ex-Partner: Not on file  . Emotionally Abused: Not on file  . Physically Abused: Not on file  . Sexually Abused: Not on file    Family History  Problem Relation Age of Onset  . Cancer Father        skin   . Breast cancer Neg Hx   . Ovarian cancer Neg Hx   . Colon cancer Neg Hx   . Diabetes Neg Hx  The following portions of the patient's history were reviewed and updated as appropriate: allergies, current medications, past family history, past medical history, past social history, past surgical history and problem list.  Review of Systems  ROS negative except as noted above. Information obtained from patient.   Objective:   BP 120/78   Pulse 78   Ht 5\' 1"  (1.549 m)   Wt 110 lb 4.8 oz (50 kg)   BMI 20.84 kg/m   CONSTITUTIONAL: Well-developed, well-nourished female in no acute distress.   BREASTS: Symmetric in size.   Left-No masses, skin changes, nipple drainage, or lymphadenopathy.   Right-Normal except for round, soft, mobile grape sized lump located at noon.    Assessment:   1. Breast lump on right side at 12 o'clock position  - BREAST LTD UNI RIGHT INC AXILLA; Future     Plan:    Ultrasound ordered, see chart.   Reviewed red flag symptoms and when to call.   RTC as previously scheduled or sooner if needed.    [place], CNM [facility], [employer]

## 2020-09-09 ENCOUNTER — Ambulatory Visit
Admission: RE | Admit: 2020-09-09 | Discharge: 2020-09-09 | Disposition: A | Discharge status: Home / Self Care | Payer: BC Managed Care – PPO | Source: Ambulatory Visit | Attending: Certified Nurse Midwife | Admitting: Certified Nurse Midwife

## 2020-09-09 ENCOUNTER — Other Ambulatory Visit: Payer: Self-pay

## 2020-09-09 DIAGNOSIS — N6315 Unspecified lump in the right breast, overlapping quadrants: Secondary | ICD-10-CM

## 2020-10-22 ENCOUNTER — Other Ambulatory Visit: Payer: Self-pay | Admitting: Certified Nurse Midwife

## 2020-10-22 ENCOUNTER — Telehealth: Payer: Self-pay

## 2020-10-22 NOTE — Telephone Encounter (Signed)
Patient called in stating that she sent a refill request via MyChart and that its showing that it was decline, patient is wanting to know why. She states she has an appointment on Thursday to get her depo injection so is unsure why she is being denied the Rx. Patient had her last annual in July of 201.  Could you please advise?

## 2020-10-22 NOTE — Telephone Encounter (Signed)
Pt made aware that she has 3 refills. Advised her to contact her pharmacy. Pt aware and verbalized understanding.

## 2020-10-24 ENCOUNTER — Other Ambulatory Visit: Payer: Self-pay

## 2020-10-24 ENCOUNTER — Ambulatory Visit (INDEPENDENT_AMBULATORY_CARE_PROVIDER_SITE_OTHER): Payer: PRIVATE HEALTH INSURANCE

## 2020-10-24 DIAGNOSIS — Z3042 Encounter for surveillance of injectable contraceptive: Secondary | ICD-10-CM

## 2020-10-24 MED ORDER — MEDROXYPROGESTERONE ACETATE 150 MG/ML IM SUSP
150.0000 mg | Freq: Once | INTRAMUSCULAR | Status: AC
  Administered 2020-10-24: 150 mg via INTRAMUSCULAR

## 2020-10-24 NOTE — Progress Notes (Signed)
Last depo inj: 08/08/20 UPT: N/A Side effects: none Next Depo- Provera injection due: 01/10/21-01/24/21 Annual exam due: 06/24/21

## 2020-12-24 ENCOUNTER — Other Ambulatory Visit: Payer: Self-pay

## 2020-12-24 ENCOUNTER — Ambulatory Visit
Admission: EM | Admit: 2020-12-24 | Discharge: 2020-12-24 | Disposition: A | Discharge status: Home / Self Care | Payer: BC Managed Care – PPO | Attending: Sports Medicine | Admitting: Sports Medicine

## 2020-12-24 DIAGNOSIS — R1031 Right lower quadrant pain: Secondary | ICD-10-CM

## 2020-12-24 DIAGNOSIS — R509 Fever, unspecified: Secondary | ICD-10-CM | POA: Insufficient documentation

## 2020-12-24 DIAGNOSIS — G8929 Other chronic pain: Secondary | ICD-10-CM | POA: Diagnosis present

## 2020-12-24 DIAGNOSIS — R110 Nausea: Secondary | ICD-10-CM | POA: Diagnosis present

## 2020-12-24 DIAGNOSIS — R630 Anorexia: Secondary | ICD-10-CM | POA: Insufficient documentation

## 2020-12-24 LAB — COMPREHENSIVE METABOLIC PANEL
ALT: 28 U/L (ref 0–44)
AST: 23 U/L (ref 15–41)
Albumin: 4.8 g/dL (ref 3.5–5.0)
Alkaline Phosphatase: 59 U/L (ref 38–126)
Anion gap: 4 — ABNORMAL LOW (ref 5–15)
BUN: 8 mg/dL (ref 6–20)
CO2: 27 mmol/L (ref 22–32)
Calcium: 9.1 mg/dL (ref 8.9–10.3)
Chloride: 108 mmol/L (ref 98–111)
Creatinine, Ser: 0.79 mg/dL (ref 0.44–1.00)
GFR, Estimated: >60 mL/min (ref >60)
Glucose, Bld: 93 mg/dL (ref 70–99)
Potassium: 3.8 mmol/L (ref 3.5–5.1)
Sodium: 139 mmol/L (ref 135–145)
Total Bilirubin: 0.6 mg/dL (ref 0.3–1.2)
Total Protein: 8.2 g/dL — ABNORMAL HIGH (ref 6.5–8.1)

## 2020-12-24 LAB — CBC WITH DIFFERENTIAL/PLATELET
Abs Immature Granulocytes: 0.02 10*3/uL (ref 0.00–0.07)
Basophils Absolute: 0 10*3/uL (ref 0.0–0.1)
Basophils Relative: 0 %
Eosinophils Absolute: 0 10*3/uL (ref 0.0–0.5)
Eosinophils Relative: 1 %
HCT: 47 % — ABNORMAL HIGH (ref 36.0–46.0)
Hemoglobin: 15.9 g/dL — ABNORMAL HIGH (ref 12.0–15.0)
Immature Granulocytes: 1 %
Lymphocytes Relative: 35 %
Lymphs Abs: 1.5 10*3/uL (ref 0.7–4.0)
MCH: 29.4 pg (ref 26.0–34.0)
MCHC: 33.8 g/dL (ref 30.0–36.0)
MCV: 87 fL (ref 80.0–100.0)
Monocytes Absolute: 0.3 10*3/uL (ref 0.1–1.0)
Monocytes Relative: 6 %
Neutro Abs: 2.5 10*3/uL (ref 1.7–7.7)
Neutrophils Relative %: 57 %
Platelets: 184 10*3/uL (ref 150–400)
RBC: 5.4 MIL/uL — ABNORMAL HIGH (ref 3.87–5.11)
RDW: 12.6 % (ref 11.5–15.5)
WBC: 4.3 10*3/uL (ref 4.0–10.5)
nRBC: 0 % (ref 0.0–0.2)

## 2020-12-24 LAB — URINALYSIS, COMPLETE (UACMP) WITH MICROSCOPIC
Bilirubin Urine: NEGATIVE
Glucose, UA: NEGATIVE mg/dL
Hgb urine dipstick: NEGATIVE
Ketones, ur: 15 mg/dL — AB
Leukocytes,Ua: NEGATIVE
Nitrite: NEGATIVE
Protein, ur: NEGATIVE mg/dL
RBC / HPF: NONE SEEN RBC/hpf (ref 0–5)
Specific Gravity, Urine: 1.025 (ref 1.005–1.030)
pH: 5.5 (ref 5.0–8.0)

## 2020-12-24 LAB — PREGNANCY, URINE: Preg Test, Ur: NEGATIVE

## 2020-12-24 MED ORDER — ONDANSETRON HCL 4 MG PO TABS: 4.0000 mg | ORAL_TABLET | Freq: Four times a day (QID) | ORAL | 0 refills | Status: DC

## 2020-12-24 NOTE — Discharge Instructions (Addendum)
Please see attached instructions.  Please reach out to your OB/GYN about your abdominal pain and nausea for 2 weeks since you do not have a primary care physician.  I am hopeful that she may see you or order an outpatient CT scan for you.  If you are unable to get the scan then you will need to go to the emergency room to have the CT scan done.

## 2020-12-24 NOTE — ED Provider Notes (Signed)
[facility]    CSN: [id number] Arrival date & time: 12/24/20  1556      History   Chief Complaint Chief Complaint  Patient presents with  . Nausea    HPI Colleen Castaneda is a 20 y.o. female.   Pleasant 20 year old female who presents for evaluation of several weeks of nausea headache and dizziness.  She is also noted some fevers and chills.  She is also having some lower abdominal pain mostly on the right side.  She denies chest pain shortness of breath sore throat.  She also denies any urinary symptoms.  No vomiting or diarrhea.  No abdominal surgeries.  She does have a history of UTIs but denies any current dysuria hematuria or increased urinary frequency.  That she has a little bit of constipation because she has not been eating much.  She has had some significant anorexia although she denies weight loss.  She denies any vaginal or genital urinary discharge.  Given the fact that her symptoms have not gotten better she comes in today for initial evaluation.     Past Medical History:  Diagnosis Date  . Irregular periods/menstrual cycles     Patient Active Problem List   Diagnosis Date Noted  . Breast lump on right side at 12 o'clock position 06/24/2020  . Breakthrough bleeding on Depo-Provera 06/22/2019    Past Surgical History:  Procedure Laterality Date  . NO PAST SURGERIES      OB History    Gravida  0   Para  0   Term  0   Preterm  0   AB  0   Living  0     SAB  0   IAB  0   Ectopic  0   Multiple  0   Live Births  0            Home Medications    Prior to Admission medications   Medication Sig Start Date End Date Taking? Authorizing Provider  medroxyPROGESTERone (DEPO-PROVERA) 150 MG/ML injection ADMINISTER 1 ML(150 MG) IN THE MUSCLE EVERY 3 MONTHS 06/24/20  Yes Lawhorn, Lara Mulch, CNM    Family History Family History  Problem Relation Age of Onset  . Cancer Father        skin   . Breast cancer Neg Hx   . Ovarian  cancer Neg Hx   . Colon cancer Neg Hx   . Diabetes Neg Hx     Social History Social History   Tobacco Use  . Smoking status: Never Smoker  . Smokeless tobacco: Never Used  Vaping Use  . Vaping Use: Some days  . Substances: Nicotine, Flavoring  Substance Use Topics  . Alcohol use: No  . Drug use: No     Allergies   Patient has no known allergies.   Review of Systems Review of Systems  Constitutional: Positive for appetite change. Negative for activity change, chills, diaphoresis, fatigue and fever.  HENT: Negative for congestion, ear pain, postnasal drip, rhinorrhea, sinus pain and sore throat.   Eyes: Negative for pain.  Respiratory: Negative for cough, chest tightness, shortness of breath, wheezing and stridor.   Cardiovascular: Negative for chest pain and palpitations.  Gastrointestinal: Positive for constipation and nausea. Negative for abdominal pain, blood in stool, diarrhea and vomiting.  Genitourinary: Negative for decreased urine volume, dysuria, flank pain, frequency, hematuria, pelvic pain, urgency, vaginal bleeding, vaginal discharge and vaginal pain.  Musculoskeletal: Negative for back pain.  Skin: Negative for color change,  pallor, rash and wound.  Neurological: Positive for dizziness. Negative for seizures, syncope, speech difficulty, weakness and headaches.  All other systems reviewed and are negative.    Physical Exam Triage Vital Signs ED Triage Vitals  Enc Vitals Group     BP 12/24/20 1731 119/79     Pulse Rate 12/24/20 1731 87     Resp 12/24/20 1731 18     Temp 12/24/20 1731 98.2 F (36.8 C)     Temp Source 12/24/20 1731 Oral     SpO2 12/24/20 1731 100 %     Weight 12/24/20 1728 110 lb (49.9 kg)     Height 12/24/20 1728 $RemoveBefor'5\' 1"'AuGbMPlBNvGA$  (1.549 m)     Head Circumference --      Peak Flow --      Pain Score 12/24/20 1728 0     Pain Loc --      Pain Edu? --      Excl. in [place]? --    No data found.  Updated Vital Signs BP 119/79 (BP Location: Left Arm)    Pulse 87   Temp 98.2 F (36.8 C) (Oral)   Resp 18   Ht $R'5\' 1"'pg$  (1.549 m)   Wt 49.9 kg   SpO2 100%   BMI 20.78 kg/m   Visual Acuity Right Eye Distance:   Left Eye Distance:   Bilateral Distance:    Right Eye Near:   Left Eye Near:    Bilateral Near:     Physical Exam Vitals and nursing note reviewed.  Constitutional:      General: She is not in acute distress.    Appearance: Normal appearance. She is not ill-appearing or toxic-appearing.  HENT:     Head: Normocephalic and atraumatic.     Right Ear: Tympanic membrane normal.     Left Ear: Tympanic membrane normal.     Nose: Nose normal. No congestion or rhinorrhea.     Mouth/Throat:     Mouth: Mucous membranes are moist.     Pharynx: No oropharyngeal exudate or posterior oropharyngeal erythema.  Eyes:     Extraocular Movements: Extraocular movements intact.     Conjunctiva/sclera: Conjunctivae normal.     Pupils: Pupils are equal, round, and reactive to light.  Cardiovascular:     Rate and Rhythm: Normal rate and regular rhythm.     Pulses: Normal pulses.     Heart sounds: Normal heart sounds. No murmur heard. No friction rub. No gallop.   Pulmonary:     Effort: Pulmonary effort is normal. No respiratory distress.     Breath sounds: Normal breath sounds. No stridor. No wheezing, rhonchi or rales.  Abdominal:     General: Abdomen is flat. Bowel sounds are normal. There is no distension.     Palpations: Abdomen is soft. There is no mass.     Tenderness: There is abdominal tenderness. There is no right CVA tenderness, left CVA tenderness, guarding or rebound.     Comments: Abdomen is soft.  She does have tenderness in the right lower quadrant although she is not guarding and does not have any rebound tenderness.  Positive bowel sounds are appreciated.  There is no distention or masses.  Musculoskeletal:        General: Normal range of motion.     Cervical back: Normal range of motion and neck supple. No rigidity or  tenderness.  Skin:    General: Skin is warm and dry.     Capillary Refill: Capillary refill takes  less than 2 seconds.  Neurological:     Mental Status: She is alert.  Psychiatric:        Mood and Affect: Mood normal.        Behavior: Behavior normal.      UC Treatments / Results  Labs (all labs ordered are listed, but only abnormal results are displayed) Labs Reviewed  COMPREHENSIVE METABOLIC PANEL - Abnormal; Notable for the following components:      Result Value   Total Protein 8.2 (*)    Anion gap 4 (*)    All other components within normal limits  URINALYSIS, COMPLETE (UACMP) WITH MICROSCOPIC - Abnormal; Notable for the following components:   Ketones, ur 15 (*)    Bacteria, UA FEW (*)    All other components within normal limits  CBC WITH DIFFERENTIAL/PLATELET - Abnormal; Notable for the following components:   RBC 5.40 (*)    Hemoglobin 15.9 (*)    HCT 47.0 (*)    All other components within normal limits  PREGNANCY, URINE    EKG   Radiology No results found.  Procedures Procedures (including critical care time)  Medications Ordered in UC Medications - No data to display  Initial Impression / Assessment and Plan / UC Course  I have reviewed the triage vital signs and the nursing notes.  Pertinent labs & imaging results that were available during my care of the patient were reviewed by me and considered in my medical decision making (see chart for details).  Clinical impression: 2 weeks of nausea headache and right lower abdominal pain.  She is also noticed a little bit of dizziness.  No COVID exposure or COVID symptoms.  There is also been some anorexia.  Treatment plan: 1.  The findings and treatment plan were discussed in detail with the patient.  The patient was in agreement. 2.  I have recommended we get some lab work here in the office.  Get a CBC and a c-Met as well as a UA and a urine pregnancy test.  Should she need advanced imaging to rule out  an abdominal process including appendicitis we will need a urine pregnancy test.  She denies sexual activity. 3.  Labs are reassuring with a few bacteria in her urine but no nitrites or leukocytes.  She does have ketones which is consistent with her anorexia.  Urine pregnancy test was negative.  CBC showed a mildly elevated H&H but was otherwise within normal limits with a normal white blood cell count. 4.  I have asked her to reach out to her OB/GYN tomorrow to see whether or not they could order the CT scan as an outpatient.  Is difficult for [place] to do that through the urgent care setting.  If she is unable to obtain that then she will need to go to the emergency room to get that CT scan done as she does not have a primary care physician. 5.  I gave her an educational handout on a potential abdominal process including appendicitis.  Just something for her to read and if she is having worsening symptoms she knows to call 911 or go directly to the emergency room. 6.  Follow-up with [place] as needed.    Final Clinical Impressions(s) / UC Diagnoses   Final diagnoses:  Nausea without vomiting  Abdominal pain, chronic, right lower quadrant  Decreased appetite  Fever, unspecified     Discharge Instructions     Please see attached instructions.  Please reach out to  your OB/GYN about your abdominal pain and nausea for 2 weeks since you do not have a primary care physician.  I am hopeful that she may see you or order an outpatient CT scan for you.  If you are unable to get the scan then you will need to go to the emergency room to have the CT scan done.    ED Prescriptions    None     PDMP not reviewed this encounter.   Verda Cumins, MD 12/24/20 2024

## 2020-12-24 NOTE — ED Triage Notes (Signed)
Patient states that she has been feeling nausea and dizziness x 2 weeks. States that she is unable to vomit or eat anything. Patient states that she did have some peaches and water today.

## 2021-01-14 ENCOUNTER — Other Ambulatory Visit: Payer: Self-pay | Admitting: Certified Nurse Midwife

## 2021-01-17 ENCOUNTER — Ambulatory Visit (INDEPENDENT_AMBULATORY_CARE_PROVIDER_SITE_OTHER): Payer: BC Managed Care – PPO | Admitting: Certified Nurse Midwife

## 2021-01-17 ENCOUNTER — Other Ambulatory Visit: Payer: Self-pay

## 2021-01-17 DIAGNOSIS — Z3042 Encounter for surveillance of injectable contraceptive: Secondary | ICD-10-CM

## 2021-01-17 MED ORDER — MEDROXYPROGESTERONE ACETATE 150 MG/ML IM SUSP
150.0000 mg | Freq: Once | INTRAMUSCULAR | Status: AC
Start: 2021-01-17 — End: 2021-01-17
  Administered 2021-01-17: 150 mg via INTRAMUSCULAR

## 2021-01-17 NOTE — Progress Notes (Signed)
I have reviewed the record and concur with patient management and plan of care.    Serafina Royals, CNM [facility], [facility] 01/17/21 5:29 PM

## 2021-01-17 NOTE — Progress Notes (Signed)
Date last pap: underage Last Depo-Provera: 10/24/2020  Side Effects if any: none Serum HCG indicated? n/a Depo-Provera 150 mg IM given by: Jari Pigg, LPN Next appointment due April 22-Apr 18, 2021.

## 2021-03-04 ENCOUNTER — Telehealth: Payer: Self-pay | Admitting: Certified Nurse Midwife

## 2021-03-04 NOTE — Telephone Encounter (Signed)
Pt states that pharmacy will not fill depo - needs approval. Pt is scheduled for next injection on 04-11-2021. [facility]

## 2021-03-04 NOTE — Telephone Encounter (Signed)
Pt aware year supply of depo erx 01/2021 to [facility] in [place]. Advised pt to call [facility] in [place] and transfer rx to [facility] in [place].

## 2021-04-07 ENCOUNTER — Ambulatory Visit: Payer: PRIVATE HEALTH INSURANCE

## 2021-04-11 ENCOUNTER — Other Ambulatory Visit: Payer: Self-pay

## 2021-04-11 ENCOUNTER — Ambulatory Visit (INDEPENDENT_AMBULATORY_CARE_PROVIDER_SITE_OTHER): Payer: BC Managed Care – PPO | Admitting: Surgical

## 2021-04-11 DIAGNOSIS — Z3042 Encounter for surveillance of injectable contraceptive: Secondary | ICD-10-CM | POA: Diagnosis not present

## 2021-04-11 MED ORDER — MEDROXYPROGESTERONE ACETATE 150 MG/ML IM SUSP
150.0000 mg | Freq: Once | INTRAMUSCULAR | Status: AC
  Administered 2021-04-11: 150 mg via INTRAMUSCULAR

## 2021-04-11 NOTE — Progress Notes (Signed)
Date last pap: NA Last Depo-Provera  01/17/2021 Side Effects if any: None Serum HCG indicated? Not Needed Depo-Provera 150 mg IM given by: J. [facility] CMA Next appointment due July 15-29

## 2021-07-04 ENCOUNTER — Ambulatory Visit (INDEPENDENT_AMBULATORY_CARE_PROVIDER_SITE_OTHER): Payer: BC Managed Care – PPO | Admitting: Certified Nurse Midwife

## 2021-07-04 ENCOUNTER — Other Ambulatory Visit: Payer: Self-pay

## 2021-07-04 VITALS — BP 120/79 | HR 84 | Ht 61.5 in | Wt 106.1 lb

## 2021-07-04 DIAGNOSIS — Z3042 Encounter for surveillance of injectable contraceptive: Secondary | ICD-10-CM

## 2021-07-04 MED ORDER — MEDROXYPROGESTERONE ACETATE 150 MG/ML IM SUSP
150.0000 mg | Freq: Once | INTRAMUSCULAR | Status: AC
  Administered 2021-07-04: 150 mg via INTRAMUSCULAR

## 2021-07-04 NOTE — Progress Notes (Signed)
Patient presents for DEPO injection administered L glute.

## 2021-09-19 ENCOUNTER — Other Ambulatory Visit: Payer: Self-pay

## 2021-09-19 ENCOUNTER — Ambulatory Visit (INDEPENDENT_AMBULATORY_CARE_PROVIDER_SITE_OTHER): Payer: BC Managed Care – PPO | Admitting: Certified Nurse Midwife

## 2021-09-19 DIAGNOSIS — Z3042 Encounter for surveillance of injectable contraceptive: Secondary | ICD-10-CM | POA: Diagnosis not present

## 2021-09-19 MED ORDER — MEDROXYPROGESTERONE ACETATE 150 MG/ML IM SUSP
150.0000 mg | Freq: Once | INTRAMUSCULAR | Status: AC
  Administered 2021-09-19: 150 mg via INTRAMUSCULAR

## 2021-09-19 NOTE — Progress Notes (Signed)
Last Depo-Provera: 07/04/2021. Side Effects if any: NA. Depo-Provera 150 mg IM given by: BR. Next appointment due 3 MO. Pt presents for routine depo injection administered R glute with no adverse reactions. No concerns/questions voiced by patient. Pt agree to f/u 3 months.

## 2021-12-18 ENCOUNTER — Telehealth: Payer: Self-pay | Admitting: Certified Nurse Midwife

## 2021-12-18 NOTE — Telephone Encounter (Signed)
Pt is scheduled for depo tomorrow- that is the last date of date range to get shot- she states that pharmacy needs auth to refill. Please Advsie.

## 2021-12-19 ENCOUNTER — Ambulatory Visit (INDEPENDENT_AMBULATORY_CARE_PROVIDER_SITE_OTHER): Payer: BC Managed Care – PPO | Admitting: Certified Nurse Midwife

## 2021-12-19 ENCOUNTER — Other Ambulatory Visit: Payer: Self-pay

## 2021-12-19 DIAGNOSIS — Z3042 Encounter for surveillance of injectable contraceptive: Secondary | ICD-10-CM | POA: Diagnosis not present

## 2021-12-19 MED ORDER — MEDROXYPROGESTERONE ACETATE 150 MG/ML IM SUSP
150.0000 mg | Freq: Once | INTRAMUSCULAR | Status: AC
  Administered 2021-12-19: 150 mg via INTRAMUSCULAR

## 2021-12-19 MED ORDER — MEDROXYPROGESTERONE ACETATE 150 MG/ML IM SUSP: INTRAMUSCULAR | 3 refills | Status: DC

## 2021-12-19 NOTE — Progress Notes (Signed)
Pt presents for routine depo injection, no adverse reactions, all questions answered.  Last Depo-Provera: 09/19/2021. Depo-Provera 150 mg IM given by: Roddie Mc, CMA. Next appointment due March 24 - March 20 2022.

## 2021-12-19 NOTE — Telephone Encounter (Signed)
REFILL SENT 

## 2022-01-31 IMAGING — US US RENAL
1 series · 14 of 25 positions shown · non-contrast
Comparison: None.

CLINICAL DATA: UTI, flank pain

EXAM:
RENAL / URINARY TRACT ULTRASOUND COMPLETE

[Series 1: us renal · 14 of 37 slices shown]
[im 1/37]
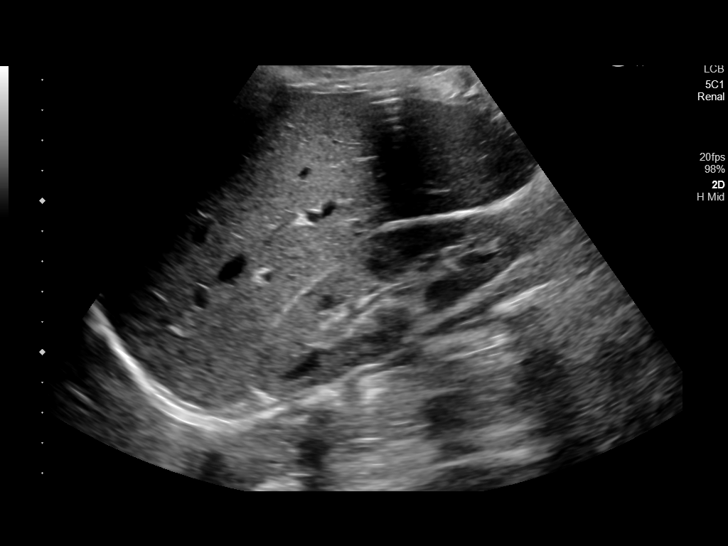
[im 4/37]
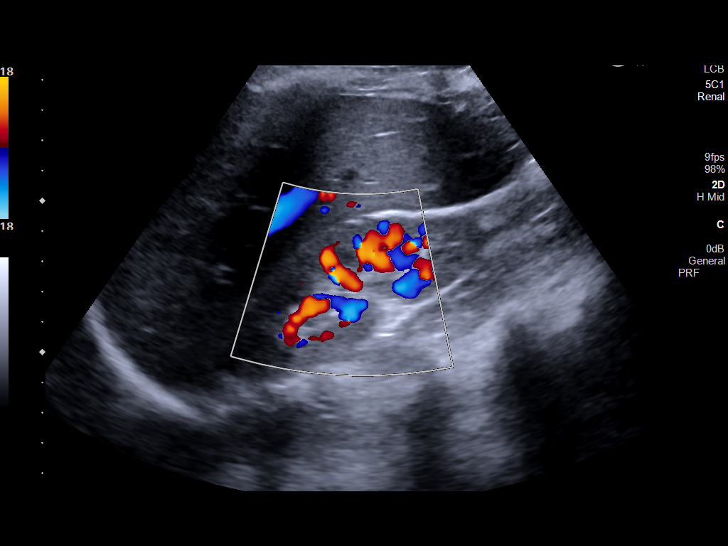
[im 7/37]
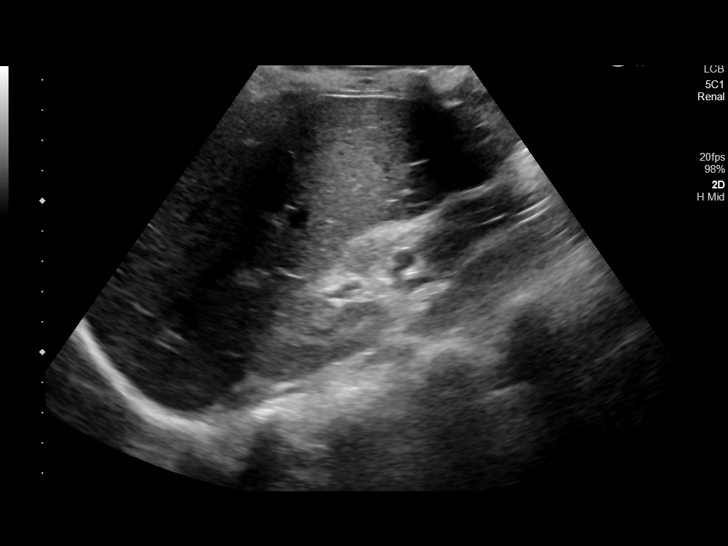
[im 10/37]
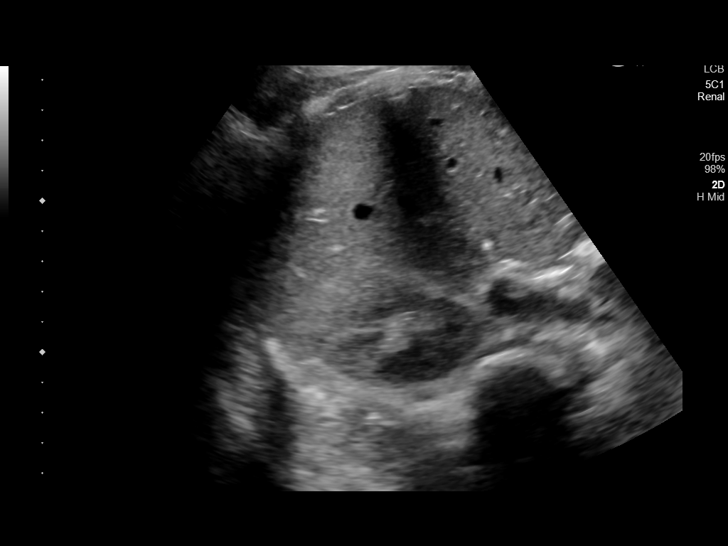
[im 13/37]
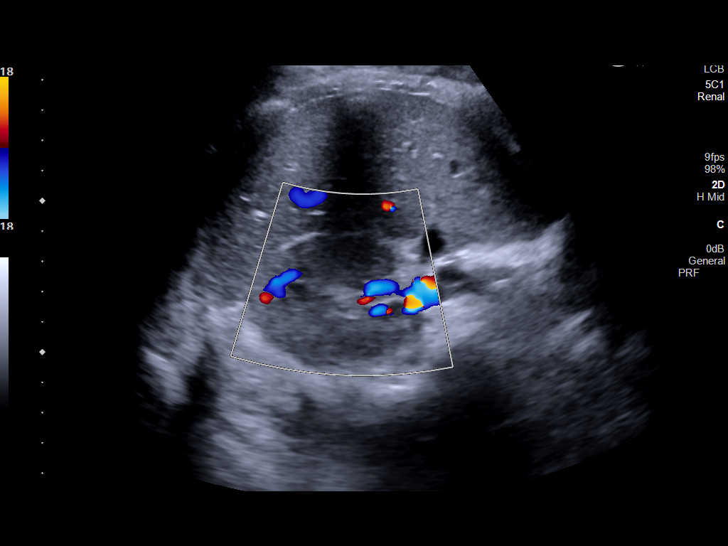
[im 14/37]
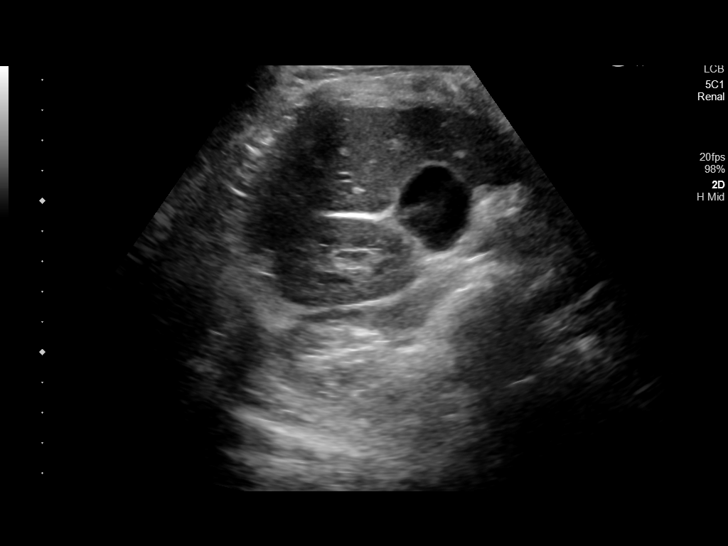
[im 17/37]
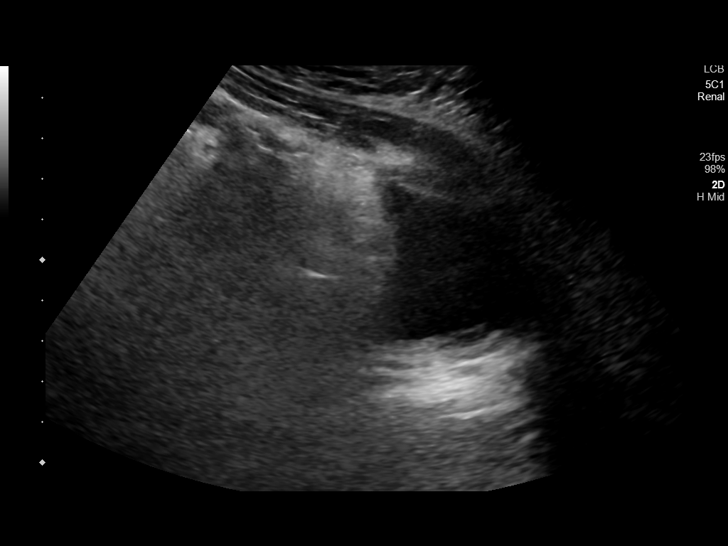
[im 20/37]
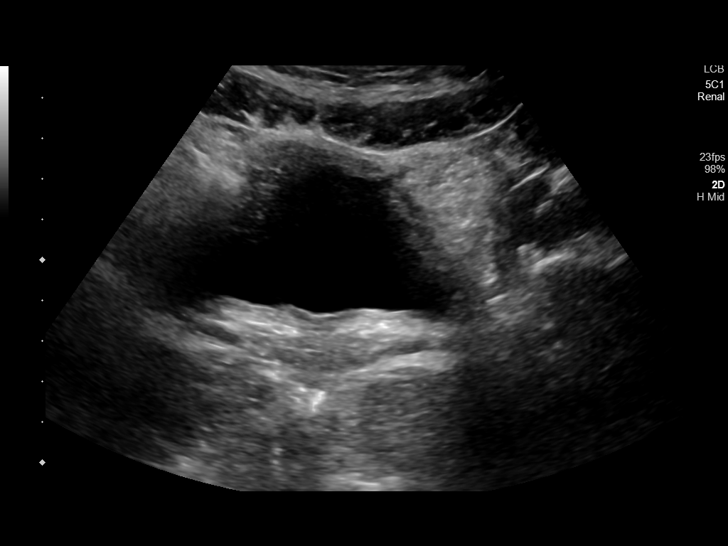
[im 23/37]
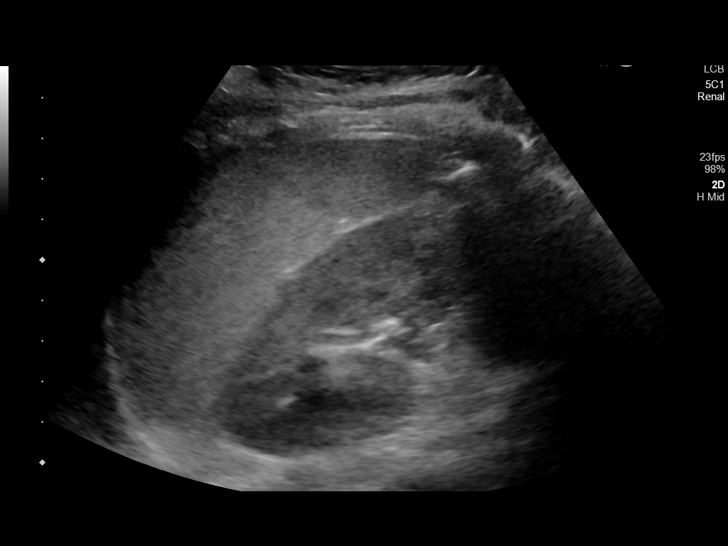
[im 25/37]
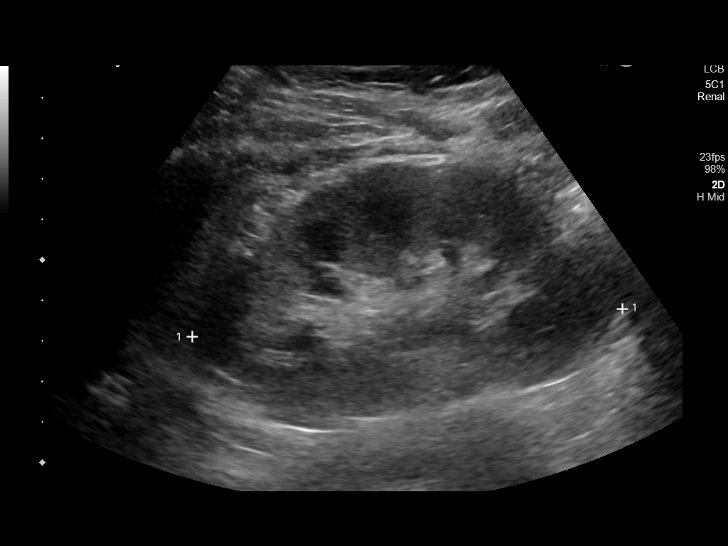
[im 28/37]
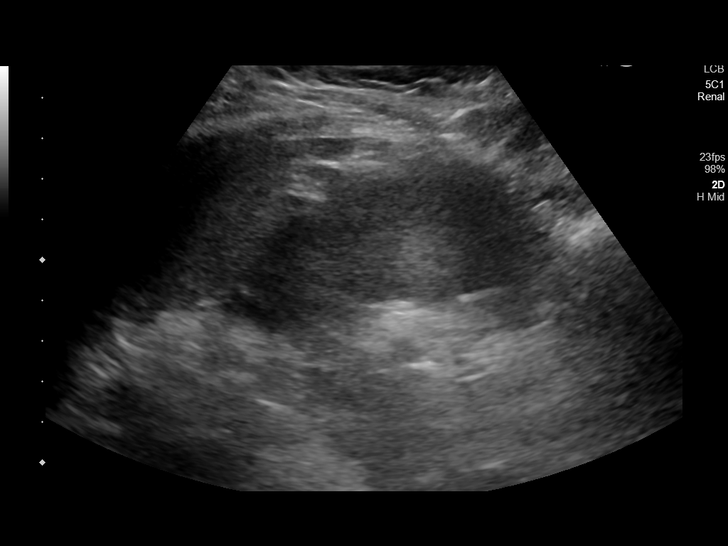
[im 31/37]
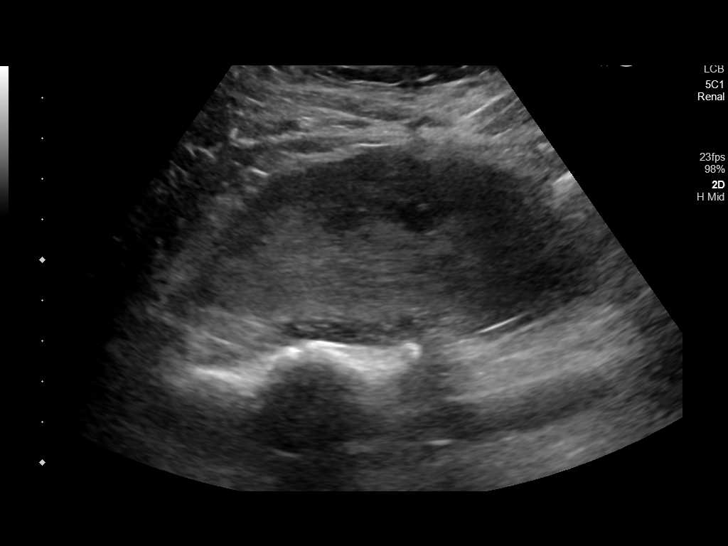
[im 34/37]
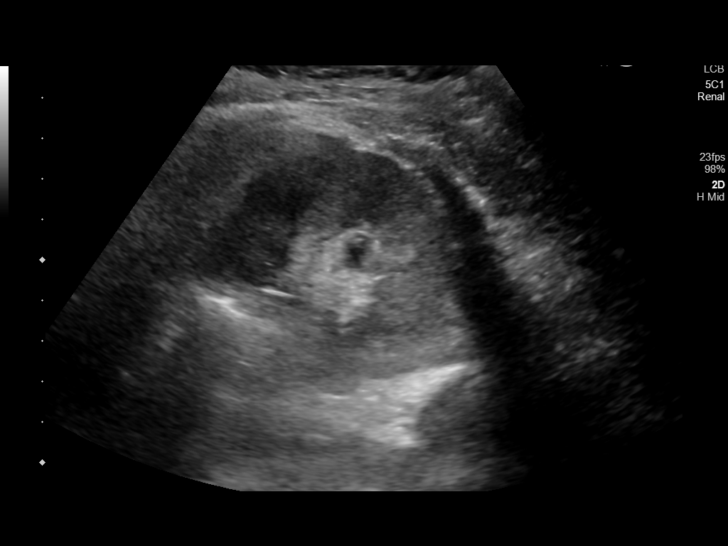
[im 37/37]
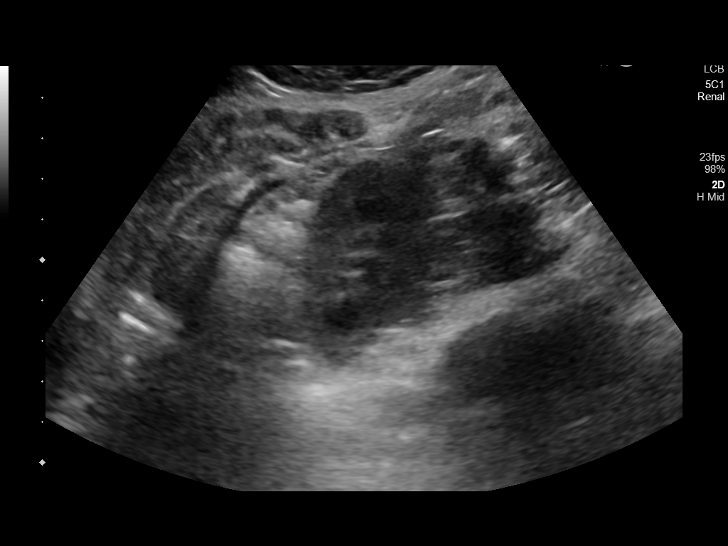

[14 of 25 positions shown; findings below may reference images not displayed]

FINDINGS: Right Kidney:

Renal measurements: 10.6 x 4.4 x 5.2 cm = volume: 126 mL .
Echogenicity within normal limits. No mass or hydronephrosis
visualized.

Left Kidney:

Renal measurements: 10.6 x 5.7 x 4.3 cm = volume: 137 mL.
Echogenicity within normal limits. No mass or hydronephrosis
visualized.

Bladder:

Appears normal for degree of bladder distention.

Other:

None.
IMPRESSION: Normal ultrasound examination of the bilateral kidneys and urinary
bladder.

## 2022-03-06 ENCOUNTER — Telehealth: Payer: Self-pay | Admitting: Certified Nurse Midwife

## 2022-03-06 ENCOUNTER — Ambulatory Visit: Payer: Self-pay

## 2022-03-06 NOTE — Telephone Encounter (Signed)
Pt called stating that she need rx for her depo - her apt is today at 2:30- confirmed pharmacy as [facility] in [place] . Please advise.  ?

## 2022-03-09 ENCOUNTER — Other Ambulatory Visit: Payer: Self-pay

## 2022-03-09 ENCOUNTER — Ambulatory Visit (INDEPENDENT_AMBULATORY_CARE_PROVIDER_SITE_OTHER): Payer: BC Managed Care – PPO | Admitting: Certified Nurse Midwife

## 2022-03-09 DIAGNOSIS — Z3042 Encounter for surveillance of injectable contraceptive: Secondary | ICD-10-CM

## 2022-03-09 MED ORDER — MEDROXYPROGESTERONE ACETATE 150 MG/ML IM SUSP
150.0000 mg | INTRAMUSCULAR | Status: AC
  Administered 2022-03-09: 150 mg via INTRAMUSCULAR

## 2022-03-09 NOTE — Progress Notes (Signed)
Date last pap: Not applicable. ?Last Depo-Provera: 12/19/2021. ?Side Effects if any: None. ?Serum HCG indicated? Not applicable. ?Depo-Provera 150 mg IM given by: Cristy Folks, CMA. ?Next appointment due: June 9 - June 26.  ?

## 2022-04-26 IMAGING — US US BREAST*R* LIMITED INC AXILLA
1 series · 4 of 4 positions shown · non-contrast
Comparison: None.

CLINICAL DATA: 19-year-old female presenting with a new lump in the
right breast.

EXAM:
ULTRASOUND OF THE RIGHT BREAST

[Series 1: us breast*right* limited inc axilla · 0.06mm/px · 4 of 4 slices shown]
[im 1/4]
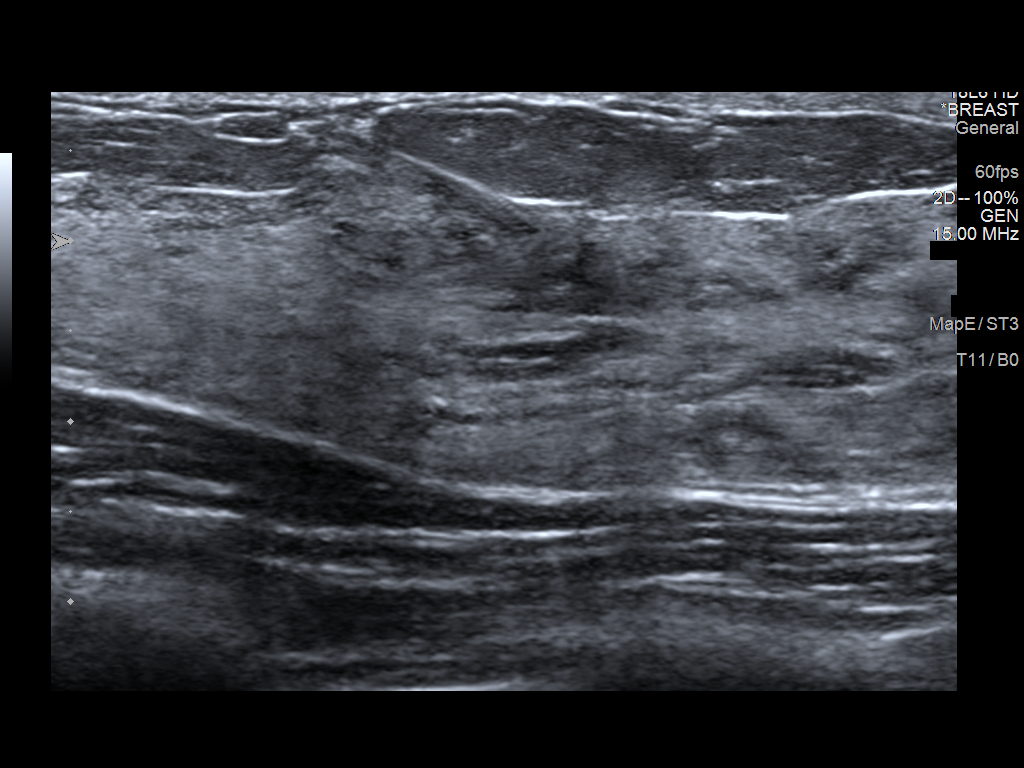
[im 2/4]
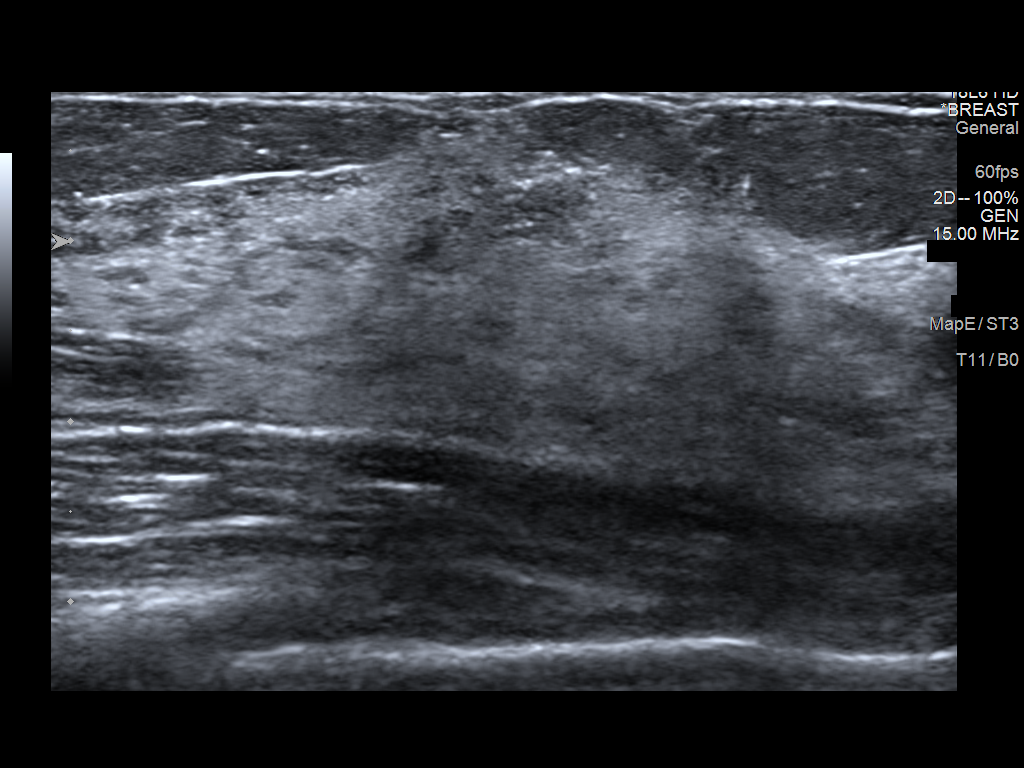
[im 3/4]
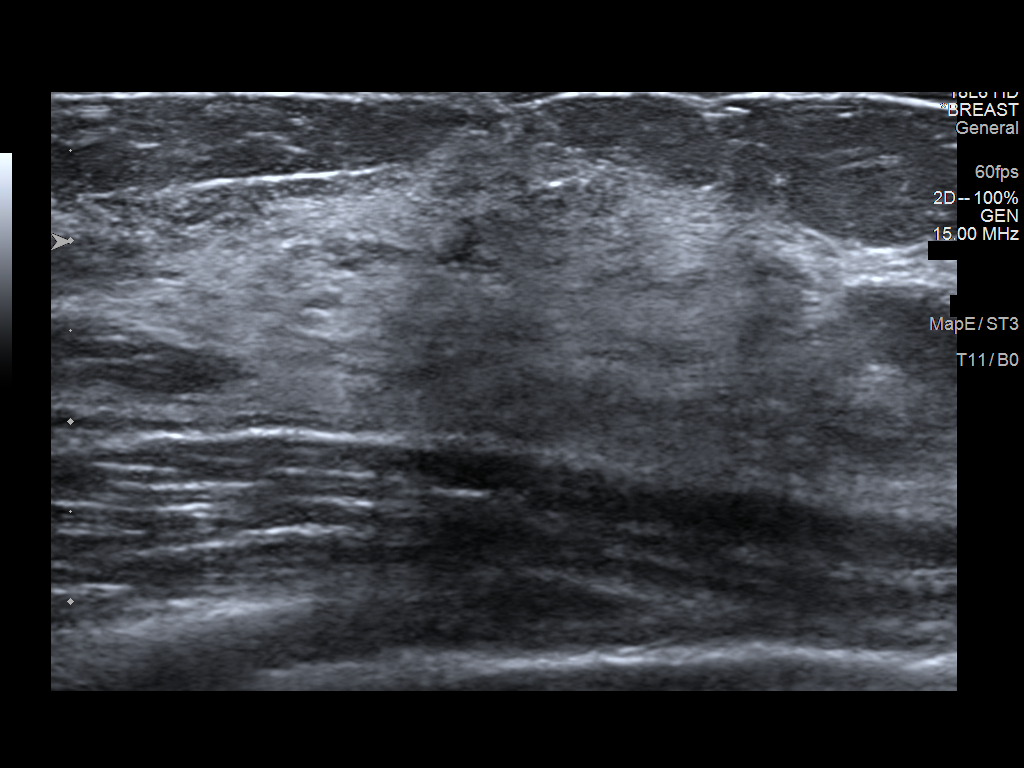
[im 4/4]
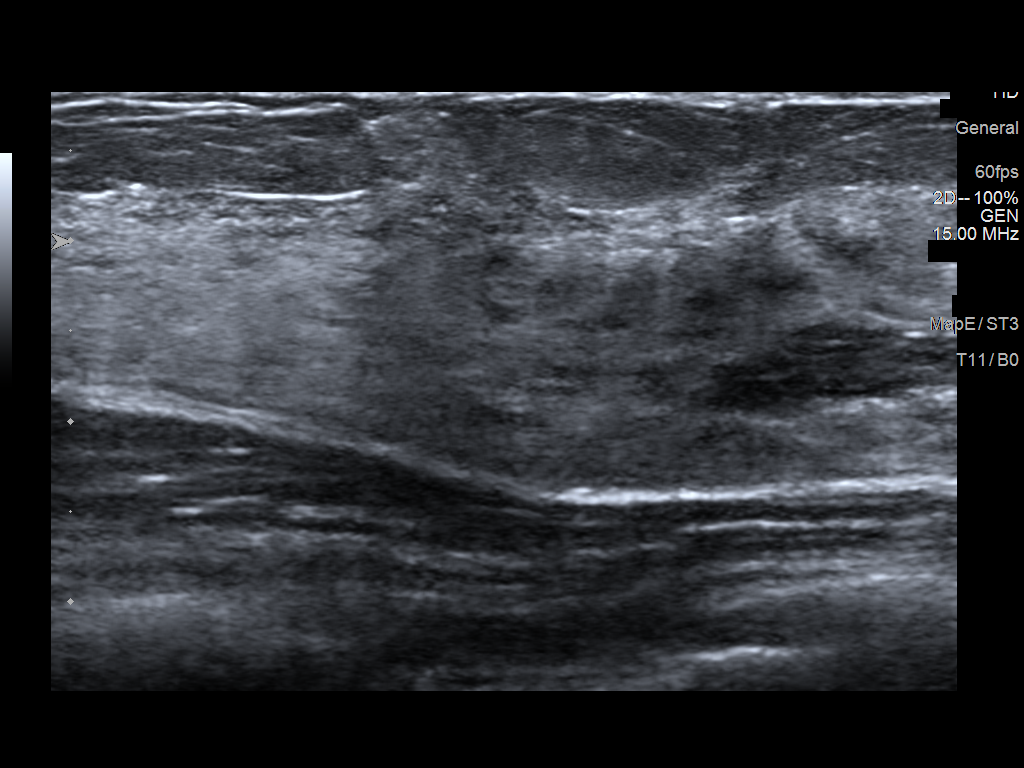

[4 of 4 positions shown; findings below may reference images not displayed]

FINDINGS: On physical exam, I palpate a ridge of tissue at the site of concern
reported by the patient in the slightly superior right breast. No
fixed discrete mass.

Targeted ultrasound is performed in the right breast at 12 o'clock 2
cm from the nipple at the palpable site of concern demonstrating a
ridge of dense tissue. No cystic or solid mass.
IMPRESSION: No sonographic evidence of malignancy or other abnormality at the
site of concern reported by the patient in the right breast.

RECOMMENDATION:
1.  Clinical follow-up as needed for the right breast.

2.  Begin routine annual screening mammography at age 40.

I have discussed the findings and recommendations with the patient.
If applicable, a reminder letter will be sent to the patient
regarding the next appointment.

BI-RADS CATEGORY  1: Negative.

## 2022-06-08 ENCOUNTER — Ambulatory Visit (INDEPENDENT_AMBULATORY_CARE_PROVIDER_SITE_OTHER): Payer: BC Managed Care – PPO | Admitting: Certified Nurse Midwife

## 2022-06-08 DIAGNOSIS — Z3042 Encounter for surveillance of injectable contraceptive: Secondary | ICD-10-CM

## 2022-06-08 MED ORDER — MEDROXYPROGESTERONE ACETATE 150 MG/ML IM SUSP
150.0000 mg | Freq: Once | INTRAMUSCULAR | Status: AC
  Administered 2022-06-08: 150 mg via INTRAMUSCULAR

## 2022-06-08 NOTE — Progress Notes (Signed)
Last Depo-Provera: 03/09/22. Side Effects if any: None. Next appointment due SEPT 11-25 2023.  Pt presents for routine depo injection, tolerated well. Pt reports no known allergies, all questions answered.

## 2022-08-24 ENCOUNTER — Ambulatory Visit: Payer: BC Managed Care – PPO

## 2022-09-04 ENCOUNTER — Ambulatory Visit (INDEPENDENT_AMBULATORY_CARE_PROVIDER_SITE_OTHER): Payer: BC Managed Care – PPO | Admitting: Certified Nurse Midwife

## 2022-09-04 DIAGNOSIS — Z3042 Encounter for surveillance of injectable contraceptive: Secondary | ICD-10-CM

## 2022-09-04 MED ORDER — MEDROXYPROGESTERONE ACETATE 150 MG/ML IM SUSP
150.0000 mg | Freq: Once | INTRAMUSCULAR | Status: AC
  Administered 2022-09-04: 150 mg via INTRAMUSCULAR

## 2022-09-04 NOTE — Progress Notes (Unsigned)
Last Depo-Provera: 06/08/22. Side Effects if any: NONE Depo-Provera 150 mg IM given by: Eshaal Duby, CMA Pt presents for routine depo injection, injection tolerated well, no interactions noted. Pt to f/u in 3 months as scheduled for next injection. All questions answered. NEXT APPT DUE DEC 8 - DEC 22 

## 2022-11-27 ENCOUNTER — Ambulatory Visit: Payer: BC Managed Care – PPO

## 2022-11-27 ENCOUNTER — Ambulatory Visit (INDEPENDENT_AMBULATORY_CARE_PROVIDER_SITE_OTHER): Payer: BC Managed Care – PPO

## 2022-11-27 VITALS — BP 121/77 | HR 70 | Ht 61.5 in | Wt 121.6 lb

## 2022-11-27 DIAGNOSIS — Z3042 Encounter for surveillance of injectable contraceptive: Secondary | ICD-10-CM

## 2022-11-27 MED ORDER — MEDROXYPROGESTERONE ACETATE 150 MG/ML IM SUSP
150.0000 mg | Freq: Once | INTRAMUSCULAR | Status: AC
  Administered 2022-11-27: 150 mg via INTRAMUSCULAR

## 2022-11-27 NOTE — Progress Notes (Signed)
Date last pap: not done due to age, will advise to schedule annual. Last Depo-Provera: 09/04/22. Side Effects if any: n/a. Serum HCG indicated? N/a. Depo-Provera 150 mg IM given by: Georgiana Shore, CMA. Next appointment due 3/2-3/16/24.

## 2023-02-18 ENCOUNTER — Ambulatory Visit (INDEPENDENT_AMBULATORY_CARE_PROVIDER_SITE_OTHER): Payer: Self-pay

## 2023-02-18 VITALS — BP 114/76 | HR 72 | Ht 61.5 in | Wt 122.0 lb

## 2023-02-18 DIAGNOSIS — Z3042 Encounter for surveillance of injectable contraceptive: Secondary | ICD-10-CM

## 2023-02-18 MED ORDER — MEDROXYPROGESTERONE ACETATE 150 MG/ML IM SUSY
150.0000 mg | PREFILLED_SYRINGE | Freq: Once | INTRAMUSCULAR | Status: AC
  Administered 2023-02-18: 150 mg via INTRAMUSCULAR

## 2023-02-18 NOTE — Progress Notes (Signed)
    NURSE VISIT NOTE  Subjective:    Patient ID: Colleen Castaneda, female    DOB: 04-28-2001, 22 y.o.   MRN: [id number]  HPI  Patient is a 22 y.o. G0P0000 female who presents for depo provera injection.   Objective:    Ht 5' 1.5" (1.562 m)   Wt 122 lb (55.3 kg)   BMI 22.68 kg/m   Last Annual: has not had advised to schedule annual . Last pap: never advised to schedule annual. Last Depo-Provera: 11/27/22. Side Effects if any: none. Serum HCG indicated? No . Depo-Provera 150 mg IM given by: Levert Feinstein, CMA. Site: Right Deltoid   Assessment:   1. Encounter for management and injection of depo-Provera      Plan:   Next appointment due between May 23  and June 6 .    Landis Gandy, [place]

## 2023-02-21 ENCOUNTER — Other Ambulatory Visit: Payer: Self-pay | Admitting: Certified Nurse Midwife

## 2023-02-22 NOTE — Telephone Encounter (Signed)
No longer needs RX we have in office

## 2023-05-13 ENCOUNTER — Ambulatory Visit: Payer: Commercial Managed Care - PPO

## 2023-05-13 VITALS — BP 110/70 | Ht 62.0 in | Wt 120.0 lb

## 2023-05-13 DIAGNOSIS — Z3042 Encounter for surveillance of injectable contraceptive: Secondary | ICD-10-CM | POA: Diagnosis not present

## 2023-05-13 MED ORDER — MEDROXYPROGESTERONE ACETATE 150 MG/ML IM SUSY
150.0000 mg | PREFILLED_SYRINGE | Freq: Once | INTRAMUSCULAR | Status: AC
  Administered 2023-05-13: 150 mg via INTRAMUSCULAR

## 2023-05-13 NOTE — Progress Notes (Signed)
    NURSE VISIT NOTE  Subjective:    Patient ID: Bufford Lope, female    DOB: [birth year], 22 y.o.   MRN: [id number]  HPI  Patient is a 22 y.o. G0P0000 female who presents for depo provera injection.   Objective:    BP 110/70   Ht 5\' 2"  (1.575 m)   Wt 120 lb (54.4 kg)   BMI 21.95 kg/m   Last Annual: Annual scheduled for 07/08/23. Advised pt this is the last depo until she has annual exam. Last pap: Never had one. Last Depo-Provera: 02/18/23. Side Effects if any: none. Serum HCG indicated? No . Depo-Provera 150 mg IM given by: Donnetta Hail, CMA. Site: Left Deltoid    Assessment:   1. Encounter for surveillance of injectable contraceptive      Plan:   Next appointment due between Aug 15 and Aug 29.    Donnetta Hail, CMA

## 2023-05-13 NOTE — Patient Instructions (Signed)

## 2023-07-08 ENCOUNTER — Other Ambulatory Visit (HOSPITAL_COMMUNITY)
Admission: RE | Admit: 2023-07-08 | Discharge: 2023-07-08 | Disposition: A | Discharge status: Home / Self Care | Payer: Self-pay | Source: Ambulatory Visit | Attending: Obstetrics and Gynecology | Admitting: Obstetrics and Gynecology

## 2023-07-08 ENCOUNTER — Ambulatory Visit (INDEPENDENT_AMBULATORY_CARE_PROVIDER_SITE_OTHER): Payer: Self-pay | Admitting: Obstetrics and Gynecology

## 2023-07-08 ENCOUNTER — Encounter: Payer: Self-pay | Admitting: Obstetrics and Gynecology

## 2023-07-08 VITALS — BP 118/60 | Ht 62.0 in | Wt 119.0 lb

## 2023-07-08 DIAGNOSIS — Z01419 Encounter for gynecological examination (general) (routine) without abnormal findings: Secondary | ICD-10-CM

## 2023-07-08 DIAGNOSIS — Z3042 Encounter for surveillance of injectable contraceptive: Secondary | ICD-10-CM

## 2023-07-08 DIAGNOSIS — Z113 Encounter for screening for infections with a predominantly sexual mode of transmission: Secondary | ICD-10-CM

## 2023-07-08 DIAGNOSIS — Z124 Encounter for screening for malignant neoplasm of cervix: Secondary | ICD-10-CM | POA: Insufficient documentation

## 2023-07-08 MED ORDER — MEDROXYPROGESTERONE ACETATE 150 MG/ML IM SUSY: 150.0000 mg | PREFILLED_SYRINGE | INTRAMUSCULAR | Status: DC

## 2023-07-08 NOTE — Patient Instructions (Signed)
I value your feedback and you entrusting us with your care. If you get a [facility] patient survey, I would appreciate you taking the time to let us know about your experience today. Thank you! ? ? ?

## 2023-07-08 NOTE — Progress Notes (Signed)
PCP:  [facility]   Chief Complaint  Patient presents with   Gynecologic Exam    No concerns     HPI:      Colleen Castaneda is a 22 y.o. G0P0000 whose LMP was No LMP recorded. Patient has had an injection., presents today for her annual examination.  Her menses are absent with depo. Occas BTB/dysmen before next injection due.   Sex activity: single partner, contraception - Depo-Provera injections. No pain/bleeding.  Last Pap: N/A due to age  There is no FH of breast cancer. There is no FH of ovarian cancer. The patient does do self-breast exams.  Tobacco use: vapes daily Alcohol use: none No drug use.  Exercise: moderately active  She does get adequate calcium but not Vitamin D in her diet. Unsure if Gardasil done.   Patient Active Problem List   Diagnosis Date Noted   Breast lump on right side at 12 o'clock position 06/24/2020   Breakthrough bleeding on Depo-Provera 06/22/2019    Past Surgical History:  Procedure Laterality Date   NO PAST SURGERIES      Family History  Problem Relation Age of Onset   Skin cancer Father    Breast cancer Neg Hx    Ovarian cancer Neg Hx    Colon cancer Neg Hx    Diabetes Neg Hx     Social History   Socioeconomic History   Marital status: Single    Spouse name: Not on file   Number of children: Not on file   Years of education: Not on file   Highest education level: Not on file  Occupational History   Not on file  Tobacco Use   Smoking status: Never   Smokeless tobacco: Never  Vaping Use   Vaping status: Every Day   Substances: Nicotine, Flavoring  Substance and Sexual Activity   Alcohol use: No   Drug use: No   Sexual activity: Yes    Birth control/protection: Injection  Other Topics Concern   Not on file  Social History Narrative   Not on file   Social Determinants of Health   Financial Resource Strain: Not on file  Food Insecurity: Not on file  Transportation Needs: Not on file  Physical  Activity: Inactive (03/21/2018)   Exercise Vital Sign    Days of Exercise per Week: 0 days    Minutes of Exercise per Session: 0 min  Stress: Not on file  Social Connections: Not on file  Intimate Partner Violence: Not on file    No current outpatient medications on file.  Current Facility-Administered Medications:    [START ON 07/26/2023] medroxyPROGESTERone Acetate SUSY 150 mg, 150 mg, Intramuscular, Q90 days,      ROS:  Review of Systems  Constitutional:  Negative for fatigue, fever and unexpected weight change.  Respiratory:  Negative for cough, shortness of breath and wheezing.   Cardiovascular:  Negative for chest pain, palpitations and leg swelling.  Gastrointestinal:  Negative for blood in stool, constipation, diarrhea, nausea and vomiting.  Endocrine: Negative for cold intolerance, heat intolerance and polyuria.  Genitourinary:  Negative for dyspareunia, dysuria, flank pain, frequency, genital sores, hematuria, menstrual problem, pelvic pain, urgency, vaginal bleeding, vaginal discharge and vaginal pain.  Musculoskeletal:  Negative for back pain, joint swelling and myalgias.  Skin:  Negative for rash.  Neurological:  Negative for dizziness, syncope, light-headedness, numbness and headaches.  Hematological:  Negative for adenopathy.  Psychiatric/Behavioral:  Negative for agitation, confusion, sleep disturbance and suicidal  ideas. The patient is not nervous/anxious.    BREAST: No symptoms   Objective: BP 118/60   Ht 5\' 2"  (1.575 m)   Wt 119 lb (54 kg)   BMI 21.77 kg/m    Physical Exam Constitutional:      Appearance: She is well-developed.  Genitourinary:     Vulva normal.     Right Labia: No rash, tenderness or lesions.    Left Labia: No tenderness, lesions or rash.    No vaginal discharge, erythema or tenderness.      Right Adnexa: not tender and no mass present.    Left Adnexa: not tender and no mass present.    No cervical friability or polyp.     Uterus  is not enlarged or tender.  Breasts:    Right: No mass, nipple discharge, skin change or tenderness.     Left: No mass, nipple discharge, skin change or tenderness.  Neck:     Thyroid: No thyromegaly.  Cardiovascular:     Rate and Rhythm: Normal rate and regular rhythm.     Heart sounds: Normal heart sounds. No murmur heard. Pulmonary:     Effort: Pulmonary effort is normal.     Breath sounds: Normal breath sounds.  Abdominal:     Palpations: Abdomen is soft.     Tenderness: There is no abdominal tenderness. There is no guarding or rebound.  Musculoskeletal:        General: Normal range of motion.     Cervical back: Normal range of motion.  Lymphadenopathy:     Cervical: No cervical adenopathy.  Neurological:     General: No focal deficit present.     Mental Status: She is alert and oriented to person, place, and time.     Cranial Nerves: No cranial nerve deficit.  Skin:    General: Skin is warm and dry.  Psychiatric:        Mood and Affect: Mood normal.        Behavior: Behavior normal.        Thought Content: Thought content normal.        Judgment: Judgment normal.  Vitals reviewed.    Assessment/Plan: Encounter for annual routine gynecological examination  Cervical cancer screening - Plan: Cytology - PAP  Screening for STD (sexually transmitted disease) - Plan: Cytology - PAP  Encounter for surveillance of injectable contraceptive - Plan: medroxyPROGESTERone Acetate SUSY 150 mg; Rx RF. Increase ca/Vit D.   Meds ordered this encounter  Medications   medroxyPROGESTERone Acetate SUSY 150 mg             GYN counsel adequate intake of calcium and vitamin D, diet and exercise; Gardasil discussed, f/u prn.      F/U  Return in about 1 year (around 07/07/2024) for depo appt with RN 8/16-29.  Vishal Sandlin B. Kimiye Strathman, PA-C 07/08/2023 2:27 PM

## 2023-07-30 ENCOUNTER — Encounter: Payer: Self-pay | Admitting: Obstetrics and Gynecology

## 2023-07-30 ENCOUNTER — Ambulatory Visit (INDEPENDENT_AMBULATORY_CARE_PROVIDER_SITE_OTHER): Payer: Self-pay

## 2023-07-30 VITALS — BP 118/78 | HR 79 | Ht 61.0 in | Wt 118.0 lb

## 2023-07-30 DIAGNOSIS — Z3042 Encounter for surveillance of injectable contraceptive: Secondary | ICD-10-CM

## 2023-07-30 MED ORDER — MEDROXYPROGESTERONE ACETATE 150 MG/ML IM SUSP
150.0000 mg | Freq: Once | INTRAMUSCULAR | Status: AC
  Administered 2023-07-30: 150 mg via INTRAMUSCULAR

## 2023-07-30 NOTE — Progress Notes (Signed)
    NURSE VISIT NOTE  Subjective:    Patient ID: Bufford Lope, female    DOB: 2001-12-13, 22 y.o.   MRN: [id number]  HPI  Patient is a 22 y.o. G0P0000 female who presents for depo provera injection.   Objective:    BP 118/78   Pulse 79   Ht 5\' 1"  (1.549 m)   Wt 118 lb (53.5 kg)   BMI 22.30 kg/m   Last Annual: 07/08/23. Last pap: 07/08/23. Last Depo-Provera: 05/13/23. Side Effects if any: n/a. Serum HCG indicated? No . Depo-Provera 150 mg IM given by: Georgiana Shore, CMA. Site: Left Deltoid   Assessment:   1. Encounter for management and injection of depo-Provera      Plan:   Next appointment due between 10/15/23 and 10/29/23.    Loman Chroman, CMA

## 2023-10-15 ENCOUNTER — Ambulatory Visit: Payer: Self-pay

## 2023-10-18 ENCOUNTER — Ambulatory Visit (INDEPENDENT_AMBULATORY_CARE_PROVIDER_SITE_OTHER): Payer: Self-pay

## 2023-10-18 VITALS — BP 107/83 | HR 67 | Ht 61.0 in | Wt 124.0 lb

## 2023-10-18 DIAGNOSIS — Z3042 Encounter for surveillance of injectable contraceptive: Secondary | ICD-10-CM

## 2023-10-18 MED ORDER — MEDROXYPROGESTERONE ACETATE 150 MG/ML IM SUSY
150.0000 mg | PREFILLED_SYRINGE | Freq: Once | INTRAMUSCULAR | Status: AC
  Administered 2023-10-18: 150 mg via INTRAMUSCULAR

## 2023-10-18 NOTE — Progress Notes (Signed)
    NURSE VISIT NOTE  Subjective:    Patient ID: Bufford Lope, female    DOB: 2001/10/16, 22 y.o.   MRN: [id number]  HPI  Patient is a 22 y.o. G0P0000 female who presents for depo provera injection.   Objective:    BP 107/83   Pulse 67   Ht 5\' 1"  (1.549 m)   Wt 124 lb (56.2 kg)   BMI 23.43 kg/m   Last Annual:  07/08/23. . Last pap:  07/08/23. . Last Depo-Provera: 07/30/2023. Side Effects if any: none. Serum HCG indicated? No . Depo-Provera 150 mg IM given by: Beverely Pace, CMA. Site: Right Deltoid    Assessment:   1. Encounter for Depo-Provera contraception      Plan:   Next appointment due between Jan 20-Feb 3   Loney Laurence, CMA

## 2024-01-05 ENCOUNTER — Ambulatory Visit (INDEPENDENT_AMBULATORY_CARE_PROVIDER_SITE_OTHER): Payer: Self-pay

## 2024-01-05 VITALS — BP 108/77 | HR 69 | Wt 126.7 lb

## 2024-01-05 DIAGNOSIS — Z3042 Encounter for surveillance of injectable contraceptive: Secondary | ICD-10-CM

## 2024-01-05 MED ORDER — MEDROXYPROGESTERONE ACETATE 150 MG/ML IM SUSY
150.0000 mg | PREFILLED_SYRINGE | Freq: Once | INTRAMUSCULAR | Status: AC
  Administered 2024-01-05: 150 mg via INTRAMUSCULAR

## 2024-01-05 NOTE — Progress Notes (Signed)
    NURSE VISIT NOTE  Subjective:    Patient ID: Colleen Castaneda, female    DOB: 21-Aug-2001, 23 y.o.   MRN: [id number]  HPI  Patient is a 23 y.o. G0P0000 female who presents for depo provera injection.   Objective:    BP 108/77 (BP Location: Left Arm, Patient Position: Sitting, Cuff Size: Normal)   Pulse 69   Wt 126 lb 11.2 oz (57.5 kg)   BMI 23.94 kg/m   Last Annual: 07/08/2023. Last pap: 07/08/2023. Last Depo-Provera: 10/18/2023. Side Effects if any: none. Serum HCG indicated? No . Depo-Provera 150 mg IM given by: Doristine Devoid, CMA. Site: Left Deltoid  Lab Review  @THIS  VISIT ONLY@  Assessment:   1. Encounter for Depo-Provera contraception      Plan:   Next appointment due between April 9th  and April 23rd.    Burtis Junes, CMA

## 2024-03-29 ENCOUNTER — Ambulatory Visit: Payer: Self-pay

## 2024-04-04 ENCOUNTER — Ambulatory Visit (INDEPENDENT_AMBULATORY_CARE_PROVIDER_SITE_OTHER): Payer: Self-pay

## 2024-04-04 VITALS — BP 121/71 | HR 61 | Ht 61.0 in | Wt 117.0 lb

## 2024-04-04 DIAGNOSIS — Z3042 Encounter for surveillance of injectable contraceptive: Secondary | ICD-10-CM

## 2024-04-04 MED ORDER — MEDROXYPROGESTERONE ACETATE 150 MG/ML IM SUSY
150.0000 mg | PREFILLED_SYRINGE | Freq: Once | INTRAMUSCULAR | Status: AC
  Administered 2024-04-04: 150 mg via INTRAMUSCULAR

## 2024-04-04 NOTE — Progress Notes (Signed)
    NURSE VISIT NOTE  Subjective:    Patient ID: Colleen Castaneda, female    DOB: [birth year], 23 y.o.   MRN: [id number]  HPI  Patient is a 23 y.o. G0P0000 female who presents for depo provera  injection.   Objective:    BP 121/71   Pulse 61   Wt 117 lb (53.1 kg)   BMI 22.11 kg/m   Last Annual: 07/08/2023. Last pap: 07/08/2023. Last Depo-Provera : 01/05/24. Side Effects if any: none. Serum HCG indicated? No . Depo-Provera  150 mg IM given by: Ila Malay, CMA. Site: Right Deltoid   Assessment:   1. Encounter for Depo-Provera  contraception      Plan:   Next appointment due between July-8-22     Abram Abraham, CMA

## 2024-06-19 NOTE — Patient Instructions (Signed)

## 2024-06-20 ENCOUNTER — Ambulatory Visit (INDEPENDENT_AMBULATORY_CARE_PROVIDER_SITE_OTHER)

## 2024-06-20 VITALS — BP 111/77 | HR 58 | Ht 61.0 in | Wt 122.4 lb

## 2024-06-20 DIAGNOSIS — Z3042 Encounter for surveillance of injectable contraceptive: Secondary | ICD-10-CM | POA: Diagnosis not present

## 2024-06-20 MED ORDER — MEDROXYPROGESTERONE ACETATE 150 MG/ML IM SUSP
150.0000 mg | Freq: Once | INTRAMUSCULAR | Status: AC
  Administered 2024-06-20: 150 mg via INTRAMUSCULAR

## 2024-06-20 NOTE — Progress Notes (Signed)
    NURSE VISIT NOTE  Subjective:    Patient ID: Laymon Lund, female    DOB: 04-Jun-2001, 23 y.o.   MRN: [id number]  HPI  Patient is a 23 y.o. G0P0000 female who presents for depo provera  injection.   Objective:    BP 111/77   Pulse (!) 58   Ht 5' 1 (1.549 m)   Wt 122 lb 6.4 oz (55.5 kg)   BMI 23.13 kg/m   Last Annual: 07/08/2023. Last pap: 07/08/2023. Last Depo-Provera : 04/04/2024. Side Effects if any: none. Serum HCG indicated? No . Depo-Provera  150 mg IM given by: Camelia Fetters, CMA. Site: Right Deltoid  Lab Review   None  Assessment:   1. Encounter for management and injection of depo-Provera       Plan:   Next appointment due between Sept 23 and October 8. Schedule appointment for Annual Exam.    Camelia Fetters, CMA [facility]

## 2024-07-11 ENCOUNTER — Ambulatory Visit (INDEPENDENT_AMBULATORY_CARE_PROVIDER_SITE_OTHER): Admitting: Obstetrics and Gynecology

## 2024-07-11 ENCOUNTER — Encounter: Payer: Self-pay | Admitting: Obstetrics and Gynecology

## 2024-07-11 ENCOUNTER — Other Ambulatory Visit (HOSPITAL_COMMUNITY)
Admission: RE | Admit: 2024-07-11 | Discharge: 2024-07-11 | Disposition: A | Discharge status: Home / Self Care | Source: Ambulatory Visit | Attending: Obstetrics and Gynecology | Admitting: Obstetrics and Gynecology

## 2024-07-11 VITALS — BP 100/64 | Ht 61.0 in | Wt 121.0 lb

## 2024-07-11 DIAGNOSIS — Z01419 Encounter for gynecological examination (general) (routine) without abnormal findings: Secondary | ICD-10-CM

## 2024-07-11 DIAGNOSIS — Z124 Encounter for screening for malignant neoplasm of cervix: Secondary | ICD-10-CM | POA: Diagnosis present

## 2024-07-11 DIAGNOSIS — Z1151 Encounter for screening for human papillomavirus (HPV): Secondary | ICD-10-CM | POA: Insufficient documentation

## 2024-07-11 DIAGNOSIS — Z3042 Encounter for surveillance of injectable contraceptive: Secondary | ICD-10-CM

## 2024-07-11 DIAGNOSIS — R87610 Atypical squamous cells of undetermined significance on cytologic smear of cervix (ASC-US): Secondary | ICD-10-CM

## 2024-07-11 DIAGNOSIS — Z113 Encounter for screening for infections with a predominantly sexual mode of transmission: Secondary | ICD-10-CM

## 2024-07-11 MED ORDER — MEDROXYPROGESTERONE ACETATE 150 MG/ML IM SUSP
150.0000 mg | INTRAMUSCULAR | Status: AC
  Administered 2024-09-07: 150 mg via INTRAMUSCULAR

## 2024-07-11 NOTE — Patient Instructions (Signed)
 I value your feedback and you entrusting [place] with your care. If you get a [place] patient survey, I would appreciate you taking the time to let us know about your experience today. Thank you! ? ? ?

## 2024-07-11 NOTE — Progress Notes (Signed)
 PCP:  [facility]   Chief Complaint  Patient presents with   Gynecologic Exam    No concerns     HPI:      [name] is a 23 y.o. G0P0000 whose LMP was No LMP recorded. Patient has had an injection., presents today for her annual examination.  Her menses are absent with depo. Occas dysmen, no BTB. Hx of menorrhagia prior to depo. Doing well  Sex activity: single partner, contraception - Depo-Provera  injections. No pain/bleeding/dryness. Last Pap: 07/08/23 Results were ASCUS/neg HPV DNA, repeat pap today  There is no FH of breast cancer. There is no FH of ovarian cancer. The patient does do self-breast exams.  Tobacco use: vapes daily Alcohol use: none No drug use.  Exercise: moderately active  She does get adequate calcium but not Vitamin D in her diet. Unsure if Gardasil done.   Patient Active Problem List   Diagnosis Date Noted   Breast lump on right side at 12 o'clock position 06/24/2020   Breakthrough bleeding on Depo-Provera  06/22/2019    Past Surgical History:  Procedure Laterality Date   NO PAST SURGERIES      Family History  Problem Relation Age of Onset   Skin cancer Father    Breast cancer Neg Hx    Ovarian cancer Neg Hx    Colon cancer Neg Hx    Diabetes Neg Hx     Social History   Socioeconomic History   Marital status: Single    Spouse name: Not on file   Number of children: Not on file   Years of education: Not on file   Highest education level: Not on file  Occupational History   Not on file  Tobacco Use   Smoking status: Never   Smokeless tobacco: Never  Vaping Use   Vaping status: Every Day   Substances: Nicotine, Flavoring  Substance and Sexual Activity   Alcohol use: No   Drug use: No   Sexual activity: Yes    Birth control/protection: Injection  Other Topics Concern   Not on file  Social History Narrative   Not on file   Social Drivers of Health   Financial Resource Strain: Not on file  Food  Insecurity: Not on file  Transportation Needs: Not on file  Physical Activity: Inactive (03/21/2018)   Exercise Vital Sign    Days of Exercise per Week: 0 days    Minutes of Exercise per Session: 0 min  Stress: Not on file  Social Connections: Not on file  Intimate Partner Violence: Not on file    No current outpatient medications on file.  Current Facility-Administered Medications:    [START ON 07/17/2024] medroxyPROGESTERone  (DEPO-PROVERA ) injection 150 mg, 150 mg, Intramuscular, Q90 days,      ROS:  Review of Systems  Constitutional:  Negative for fatigue, fever and unexpected weight change.  Respiratory:  Negative for cough, shortness of breath and wheezing.   Cardiovascular:  Negative for chest pain, palpitations and leg swelling.  Gastrointestinal:  Negative for blood in stool, constipation, diarrhea, nausea and vomiting.  Endocrine: Negative for cold intolerance, heat intolerance and polyuria.  Genitourinary:  Negative for dyspareunia, dysuria, flank pain, frequency, genital sores, hematuria, menstrual problem, pelvic pain, urgency, vaginal bleeding, vaginal discharge and vaginal pain.  Musculoskeletal:  Negative for back pain, joint swelling and myalgias.  Skin:  Negative for rash.  Neurological:  Negative for dizziness, syncope, light-headedness, numbness and headaches.  Hematological:  Negative for adenopathy.  Psychiatric/Behavioral:  Negative for agitation, confusion, sleep disturbance and suicidal ideas. The patient is not nervous/anxious.    BREAST: No symptoms   Objective: BP 100/64   Ht 5' 1 (1.549 m)   Wt 121 lb (54.9 kg)   BMI 22.86 kg/m    Physical Exam Constitutional:      Appearance: She is well-developed.  Genitourinary:     Vulva normal.     Right Labia: No rash, tenderness or lesions.    Left Labia: No tenderness, lesions or rash.    No vaginal discharge, erythema or tenderness.      Right Adnexa: not tender and no mass present.    Left  Adnexa: not tender and no mass present.    No cervical friability or polyp.     Uterus is not enlarged or tender.  Breasts:    Right: No mass, nipple discharge, skin change or tenderness.     Left: No mass, nipple discharge, skin change or tenderness.  Neck:     Thyroid: No thyromegaly.  Cardiovascular:     Rate and Rhythm: Normal rate and regular rhythm.     Heart sounds: Normal heart sounds. No murmur heard. Pulmonary:     Effort: Pulmonary effort is normal.     Breath sounds: Normal breath sounds.  Abdominal:     Palpations: Abdomen is soft.     Tenderness: There is no abdominal tenderness. There is no guarding or rebound.  Musculoskeletal:        General: Normal range of motion.     Cervical back: Normal range of motion.  Lymphadenopathy:     Cervical: No cervical adenopathy.  Neurological:     General: No focal deficit present.     Mental Status: She is alert and oriented to person, place, and time.     Cranial Nerves: No cranial nerve deficit.  Skin:    General: Skin is warm and dry.  Psychiatric:        Mood and Affect: Mood normal.        Behavior: Behavior normal.        Thought Content: Thought content normal.        Judgment: Judgment normal.  Vitals reviewed.    Assessment/Plan: Encounter for annual routine gynecological examination  Cervical cancer screening - Plan: Cytology - PAP  Screening for STD (sexually transmitted disease)  Screening for HPV (human papillomavirus) - Plan: Cytology - PAP  ASCUS of cervix with negative high risk HPV - Plan: Cytology - PAP; repeat pap today. Will f/u if abn.   Encounter for surveillance of injectable contraceptive - Plan: medroxyPROGESTERone  (DEPO-PROVERA ) injection 150 mg; Rx RF, cont ca/add Vit D.    Meds ordered this encounter  Medications   medroxyPROGESTERone  (DEPO-PROVERA ) injection 150 mg             GYN counsel adequate intake of calcium and vitamin D, diet and exercise; Gardasil discussed, f/u prn.       F/U  Return in about 1 year (around 07/11/2025).  Colleen Castaneda B. Colleen Grefe, PA-C 07/11/2024 3:28 PM

## 2024-07-18 ENCOUNTER — Ambulatory Visit: Payer: Self-pay | Admitting: Obstetrics and Gynecology

## 2024-07-18 LAB — CYTOLOGY - PAP
Adequacy: ABSENT
Chlamydia: NEGATIVE
Comment: NEGATIVE
Comment: NEGATIVE
Comment: NORMAL
Diagnosis: UNDETERMINED — AB
High risk HPV: POSITIVE — AB
Neisseria Gonorrhea: NEGATIVE

## 2024-09-05 ENCOUNTER — Ambulatory Visit

## 2024-09-07 ENCOUNTER — Ambulatory Visit

## 2024-09-07 VITALS — BP 103/73 | HR 59 | Ht 61.5 in | Wt 121.6 lb

## 2024-09-07 DIAGNOSIS — Z3042 Encounter for surveillance of injectable contraceptive: Secondary | ICD-10-CM | POA: Diagnosis not present

## 2024-09-07 NOTE — Progress Notes (Signed)
    NURSE VISIT NOTE  Subjective:    Patient ID: Colleen Castaneda, female    DOB: 2001/06/25, 23 y.o.   MRN: [id number]  HPI  Patient is a 23 y.o. G0P0000 female who presents for depo provera  injection.   Objective:    BP 103/73   Pulse (!) 59   Ht 5' 1.5 (1.562 m)   Wt 121 lb 9.6 oz (55.2 kg)   BMI 22.60 kg/m   Last Annual: 07/11/24. Last pap: 07/11/23. Last Depo-Provera : 06/20/24. Side Effects if any: none. Serum HCG indicated? No . Depo-Provera  150 mg IM given by: Camelia Bars, LPN. Site: Left Deltoid    Assessment:   1. Encounter for Depo-Provera  contraception      Plan:   Next appointment due between 12/11 and 12/07/24.    Camelia Bars, LPN

## 2024-09-07 NOTE — Patient Instructions (Signed)

## 2024-11-28 NOTE — Progress Notes (Unsigned)
° ° °  NURSE VISIT NOTE  Subjective:    Patient ID: Colleen Castaneda, female    DOB: 07-06-2001, 23 y.o.   MRN: [id number]  HPI  Patient is a 23 y.o. G0P0000 female who presents for depo provera  injection.   Objective:    There were no vitals taken for this visit.  Last Annual: 07/11/24. Last pap: 07/11/24. Last Depo-Provera : 09/07/24. Side Effects if any: none. Serum HCG indicated? No . Depo-Provera  150 mg IM given by: Harlene Gander, CMA. Site: {AOB INJ [id number]  Lab Review  No results found for any visits on 11/29/24.  Assessment:   1. Surveillance for Depo-Provera  contraception      Plan:   Next appointment due between 02/13/25 and 02/27/25.    Harlene Gander, CMA

## 2024-11-29 ENCOUNTER — Ambulatory Visit

## 2024-11-29 VITALS — BP 116/74 | HR 89 | Ht 61.5 in | Wt 123.0 lb

## 2024-11-29 DIAGNOSIS — Z3042 Encounter for surveillance of injectable contraceptive: Secondary | ICD-10-CM

## 2024-11-29 MED ORDER — MEDROXYPROGESTERONE ACETATE 150 MG/ML IM SUSP
150.0000 mg | Freq: Once | INTRAMUSCULAR | Status: AC
  Administered 2024-11-29: 13:00:00 150 mg via INTRAMUSCULAR

## 2024-11-30 ENCOUNTER — Ambulatory Visit

## 2025-02-21 ENCOUNTER — Ambulatory Visit
